# Patient Record
Sex: Male | Born: 1970
Health system: Southern US, Community
[De-identification: ages and names within clinical notes are randomized; demographics above are authoritative.]

## PROBLEM LIST (undated history)

## (undated) DIAGNOSIS — E119 Type 2 diabetes mellitus without complications: Secondary | ICD-10-CM

## (undated) DIAGNOSIS — G629 Polyneuropathy, unspecified: Secondary | ICD-10-CM

## (undated) HISTORY — DX: Type 2 diabetes mellitus without complications: E11.9

---

## 1998-01-20 ENCOUNTER — Emergency Department (HOSPITAL_COMMUNITY): Admission: EM | Admit: 1998-01-20 | Discharge: 1998-01-20 | Payer: Self-pay | Admitting: Emergency Medicine

## 2004-02-09 ENCOUNTER — Ambulatory Visit (HOSPITAL_COMMUNITY): Admission: RE | Admit: 2004-02-09 | Discharge: 2004-02-09 | Payer: Self-pay | Admitting: *Deleted

## 2004-02-09 ENCOUNTER — Emergency Department (HOSPITAL_COMMUNITY): Admission: EM | Admit: 2004-02-09 | Discharge: 2004-02-09 | Payer: Self-pay | Admitting: *Deleted

## 2006-09-14 ENCOUNTER — Emergency Department (HOSPITAL_COMMUNITY): Admission: EM | Admit: 2006-09-14 | Discharge: 2006-09-14 | Payer: Self-pay | Admitting: Emergency Medicine

## 2007-10-06 ENCOUNTER — Emergency Department (HOSPITAL_COMMUNITY): Admission: EM | Admit: 2007-10-06 | Discharge: 2007-10-06 | Payer: Self-pay | Admitting: Emergency Medicine

## 2008-04-06 ENCOUNTER — Ambulatory Visit: Payer: Self-pay | Admitting: Internal Medicine

## 2008-04-06 DIAGNOSIS — E669 Obesity, unspecified: Secondary | ICD-10-CM | POA: Insufficient documentation

## 2008-04-06 DIAGNOSIS — J309 Allergic rhinitis, unspecified: Secondary | ICD-10-CM | POA: Insufficient documentation

## 2008-04-06 DIAGNOSIS — R030 Elevated blood-pressure reading, without diagnosis of hypertension: Secondary | ICD-10-CM

## 2008-04-10 ENCOUNTER — Telehealth: Payer: Self-pay | Admitting: Internal Medicine

## 2008-04-11 ENCOUNTER — Encounter: Admission: RE | Admit: 2008-04-11 | Discharge: 2008-04-11 | Payer: Self-pay | Admitting: Internal Medicine

## 2008-09-25 ENCOUNTER — Emergency Department (HOSPITAL_COMMUNITY): Admission: EM | Admit: 2008-09-25 | Discharge: 2008-09-25 | Payer: Self-pay | Admitting: Emergency Medicine

## 2008-11-13 ENCOUNTER — Ambulatory Visit: Payer: Self-pay | Admitting: Internal Medicine

## 2008-11-13 DIAGNOSIS — R7309 Other abnormal glucose: Secondary | ICD-10-CM

## 2008-11-13 LAB — CONVERTED CEMR LAB
BUN: 9 mg/dL (ref 6–23)
CO2: 30 meq/L (ref 19–32)
Calcium: 9.3 mg/dL (ref 8.4–10.5)
Chloride: 103 meq/L (ref 96–112)
Creatinine, Ser: 1.1 mg/dL (ref 0.4–1.5)
GFR calc non Af Amer: 97.18 mL/min (ref >60)
Glucose, Bld: 125 mg/dL — ABNORMAL HIGH (ref 70–99)
Hgb A1c MFr Bld: 6.1 % (ref 4.6–6.5)
Potassium: 4.1 meq/L (ref 3.5–5.1)
Sodium: 140 meq/L (ref 135–145)

## 2008-11-28 ENCOUNTER — Emergency Department (HOSPITAL_COMMUNITY): Admission: EM | Admit: 2008-11-28 | Discharge: 2008-11-28 | Payer: Self-pay | Admitting: Emergency Medicine

## 2009-02-28 ENCOUNTER — Encounter: Admission: RE | Admit: 2009-02-28 | Discharge: 2009-02-28 | Payer: Self-pay | Admitting: *Deleted

## 2009-07-22 ENCOUNTER — Emergency Department (HOSPITAL_COMMUNITY): Admission: EM | Admit: 2009-07-22 | Discharge: 2009-07-22 | Payer: Self-pay | Admitting: Emergency Medicine

## 2009-08-08 ENCOUNTER — Observation Stay (HOSPITAL_COMMUNITY): Admission: EM | Admit: 2009-08-08 | Discharge: 2009-08-09 | Payer: Self-pay | Admitting: Emergency Medicine

## 2010-05-26 IMAGING — CR DG NECK SOFT TISSUE
2 series · 2 of 2 positions shown · non-contrast
Comparison: Cervical series 02/28/2009.

CLINICAL DATA: 39-year-old male with recent sinus infection.
Facial swelling.  Fever.  Possible allergic reaction.

NECK SOFT TISSUES - 1+ VIEW

[w soft tissue neck *]
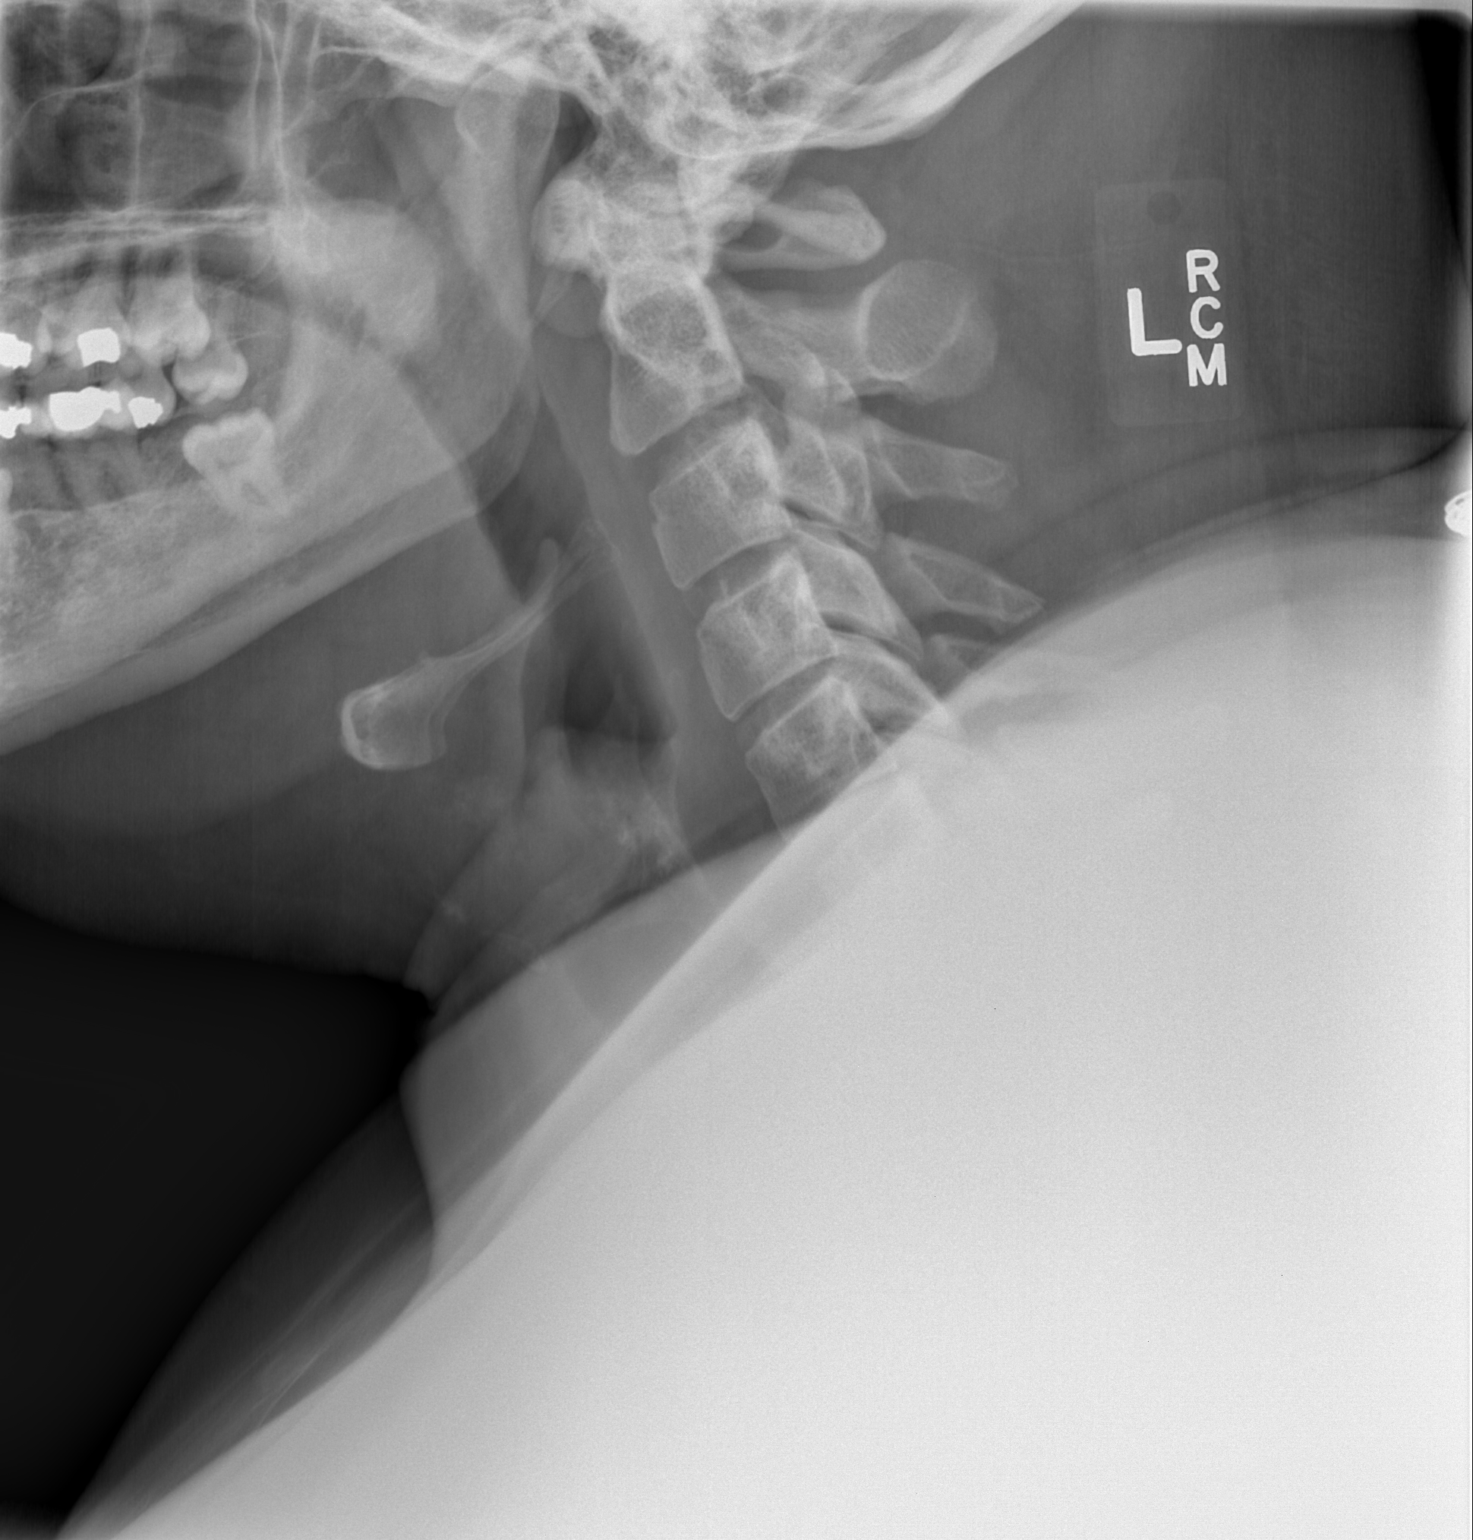

[w soft tissue neck ap]
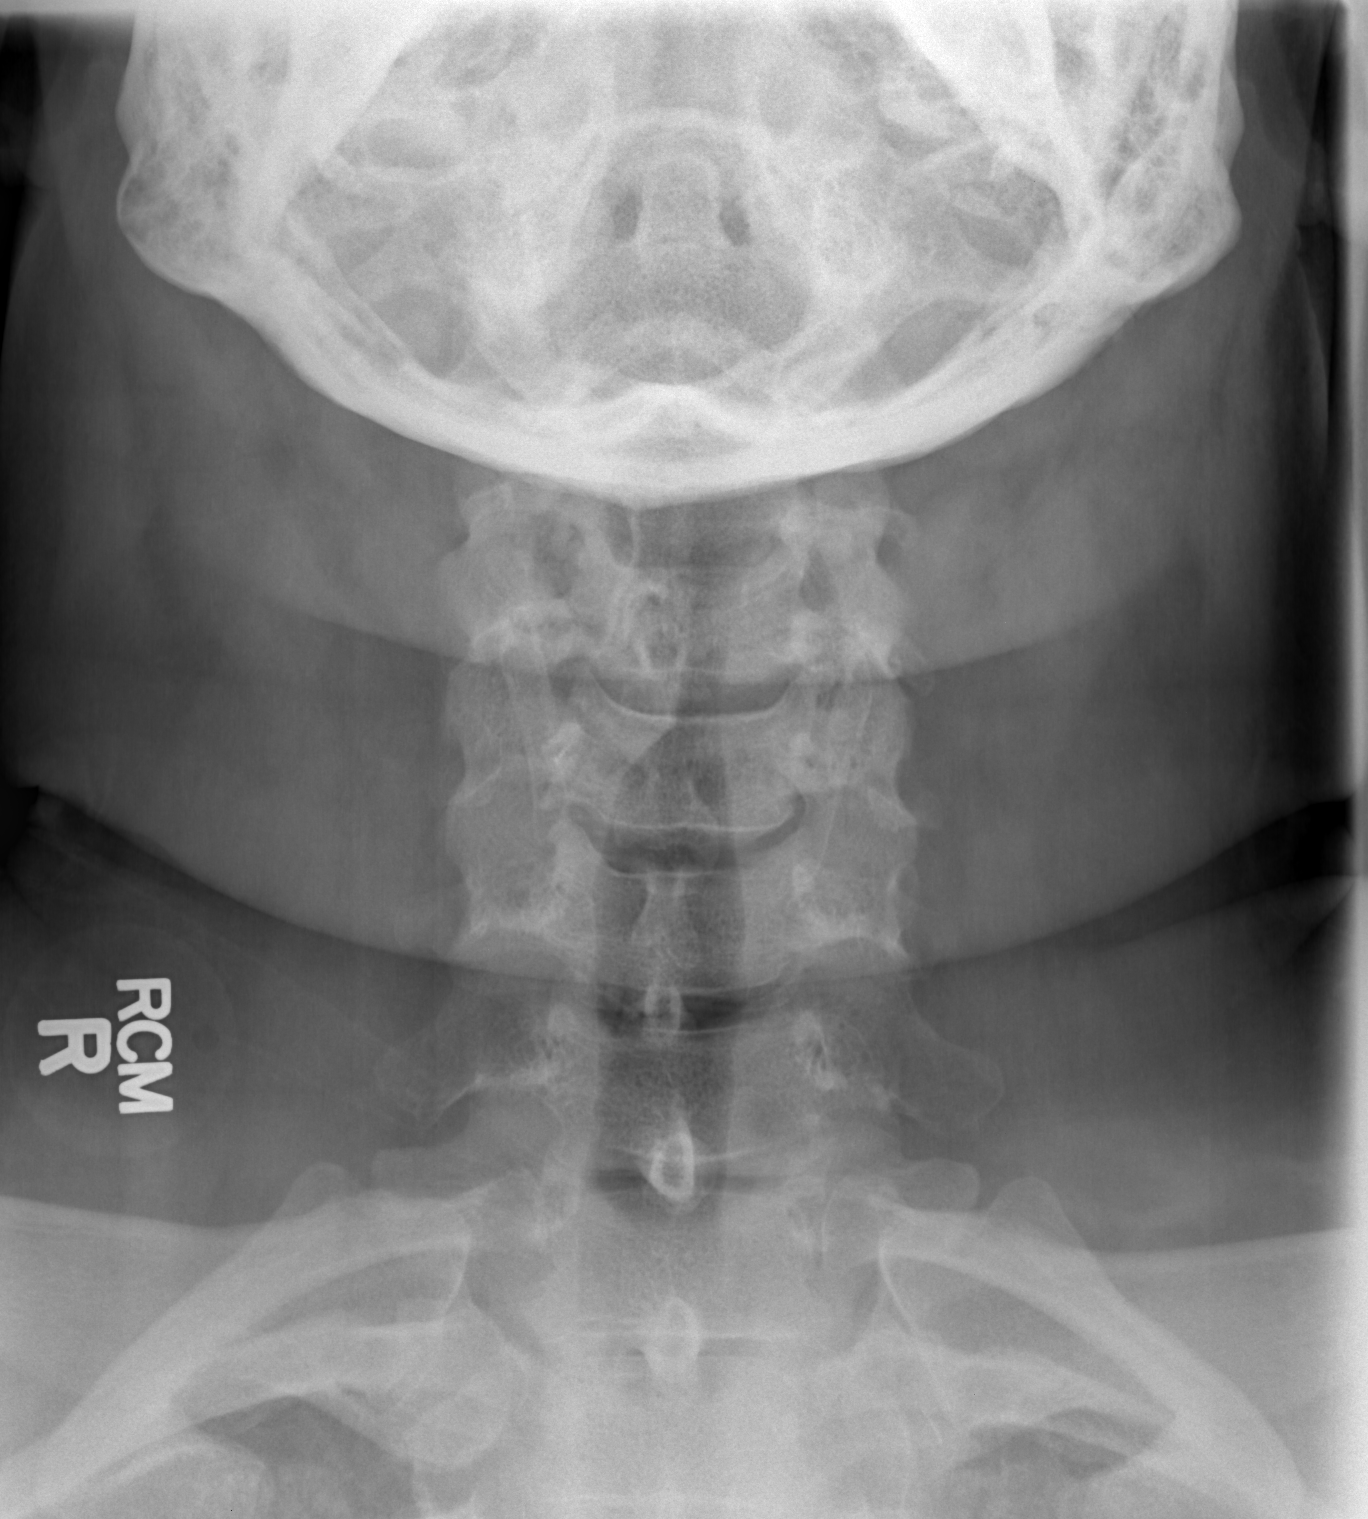

[2 of 2 positions shown; findings below may reference images not displayed]

FINDINGS: Stable prevertebral soft tissue contours.  Large body
habitus.  Pharyngeal soft tissue contours including epiglottis are
within normal limits. Visualized tracheal air column is within
normal limits.
IMPRESSION: Pharyngeal soft tissue contours appear stable compared to 2939.

## 2010-06-02 LAB — CONVERTED CEMR LAB
ALT: 26 units/L (ref 0–53)
AST: 24 units/L (ref 0–37)
Albumin: 3.6 g/dL (ref 3.5–5.2)
Alkaline Phosphatase: 73 units/L (ref 39–117)
BUN: 9 mg/dL (ref 6–23)
Bacteria, UA: NEGATIVE
Basophils Absolute: 0 10*3/uL (ref 0.0–0.1)
Basophils Relative: 0.5 % (ref 0.0–3.0)
Bilirubin Urine: NEGATIVE
Bilirubin, Direct: 0.1 mg/dL (ref 0.0–0.3)
CO2: 29 meq/L (ref 19–32)
Calcium: 8.8 mg/dL (ref 8.4–10.5)
Chloride: 108 meq/L (ref 96–112)
Creatinine, Ser: 1.1 mg/dL (ref 0.4–1.5)
Crystals: NEGATIVE
Eosinophils Absolute: 0.2 10*3/uL (ref 0.0–0.7)
Eosinophils Relative: 2.7 % (ref 0.0–5.0)
GFR calc Af Amer: 98 mL/min
GFR calc non Af Amer: 81 mL/min
Glucose, Bld: 109 mg/dL — ABNORMAL HIGH (ref 70–99)
HCT: 39.3 % (ref 39.0–52.0)
Hemoglobin, Urine: NEGATIVE
Hemoglobin: 14.2 g/dL (ref 13.0–17.0)
Ketones, ur: NEGATIVE mg/dL
Leukocytes, UA: NEGATIVE
Lymphocytes Relative: 31.7 % (ref 12.0–46.0)
MCHC: 36 g/dL (ref 30.0–36.0)
MCV: 91.6 fL (ref 78.0–100.0)
Monocytes Absolute: 0.4 10*3/uL (ref 0.1–1.0)
Monocytes Relative: 5.6 % (ref 3.0–12.0)
Neutro Abs: 4.7 10*3/uL (ref 1.4–7.7)
Neutrophils Relative %: 59.5 % (ref 43.0–77.0)
Nitrite: NEGATIVE
Platelets: 266 10*3/uL (ref 150–400)
Potassium: 3.9 meq/L (ref 3.5–5.1)
RBC / HPF: NONE SEEN
RBC: 4.29 M/uL (ref 4.22–5.81)
RDW: 12.9 % (ref 11.5–14.6)
Sodium: 142 meq/L (ref 135–145)
Specific Gravity, Urine: 1.015 (ref 1.000–1.03)
TSH: 1.95 microintl units/mL (ref 0.35–5.50)
Total Bilirubin: 0.6 mg/dL (ref 0.3–1.2)
Total Protein, Urine: NEGATIVE mg/dL
Total Protein: 6.9 g/dL (ref 6.0–8.3)
Urine Glucose: NEGATIVE mg/dL
Urobilinogen, UA: 0.2 (ref 0.0–1.0)
WBC: 7.7 10*3/uL (ref 4.5–10.5)
pH: 7.5 (ref 5.0–8.0)

## 2010-06-06 NOTE — Letter (Signed)
Summary: Results Follow-up Letter  [facility]  [address] [place], [place] [address]   Phone: [phone]  Fax: [phone]    04/06/2008  [address] [place], [place]  [address]  Dear Guy Gonzalez,   The following are the results of your recent test(s):  Test       Result     Complete blood count   Normal Blood sugar       Slightly elevated Liver/kidney     Normal Urine         Normal Thyroid       Normal  _________________________________________________________  Please call for an appointment as directed _________________________________________________________ _________________________________________________________ _________________________________________________________  Sincerely,  Sanda Linger MD [facility]

## 2010-07-24 LAB — URINALYSIS, ROUTINE W REFLEX MICROSCOPIC
Bilirubin Urine: NEGATIVE
Glucose, UA: NEGATIVE mg/dL
Hgb urine dipstick: NEGATIVE
Ketones, ur: NEGATIVE mg/dL
Nitrite: NEGATIVE
Protein, ur: NEGATIVE mg/dL
Specific Gravity, Urine: 1.014 (ref 1.005–1.030)
Urobilinogen, UA: 0.2 mg/dL (ref 0.0–1.0)
pH: 5.5 (ref 5.0–8.0)

## 2010-07-24 LAB — BASIC METABOLIC PANEL
BUN: 10 mg/dL (ref 6–23)
CO2: 28 mEq/L (ref 19–32)
Calcium: 8.8 mg/dL (ref 8.4–10.5)
Chloride: 103 mEq/L (ref 96–112)
Creatinine, Ser: 1.16 mg/dL (ref 0.4–1.5)
GFR calc Af Amer: >60 mL/min (ref >60)
GFR calc non Af Amer: >60 mL/min (ref >60)
Glucose, Bld: 147 mg/dL — ABNORMAL HIGH (ref 70–99)
Potassium: 4.1 mEq/L (ref 3.5–5.1)
Sodium: 136 mEq/L (ref 135–145)

## 2010-07-24 LAB — CBC
HCT: 42.9 % (ref 39.0–52.0)
Hemoglobin: 14.3 g/dL (ref 13.0–17.0)
MCHC: 33.3 g/dL (ref 30.0–36.0)
MCV: 94.8 fL (ref 78.0–100.0)
Platelets: 270 10*3/uL (ref 150–400)
RBC: 4.53 MIL/uL (ref 4.22–5.81)
RDW: 13.7 % (ref 11.5–15.5)
WBC: 10.3 10*3/uL (ref 4.0–10.5)

## 2010-07-24 LAB — DIFFERENTIAL
Basophils Absolute: 0 10*3/uL (ref 0.0–0.1)
Basophils Relative: 0 % (ref 0–1)
Eosinophils Absolute: 0.1 10*3/uL (ref 0.0–0.7)
Eosinophils Relative: 1 % (ref 0–5)
Lymphocytes Relative: 20 % (ref 12–46)
Lymphs Abs: 2 10*3/uL (ref 0.7–4.0)
Monocytes Absolute: 0.2 10*3/uL (ref 0.1–1.0)
Monocytes Relative: 2 % — ABNORMAL LOW (ref 3–12)
Neutro Abs: 7.9 10*3/uL — ABNORMAL HIGH (ref 1.7–7.7)
Neutrophils Relative %: 77 % (ref 43–77)

## 2010-08-11 LAB — BASIC METABOLIC PANEL
BUN: 9 mg/dL (ref 6–23)
CO2: 24 mEq/L (ref 19–32)
Calcium: 9 mg/dL (ref 8.4–10.5)
Chloride: 106 mEq/L (ref 96–112)
Creatinine, Ser: 0.93 mg/dL (ref 0.4–1.5)
GFR calc Af Amer: >60 mL/min (ref >60)
GFR calc non Af Amer: >60 mL/min (ref >60)
Glucose, Bld: 128 mg/dL — ABNORMAL HIGH (ref 70–99)
Potassium: 3.9 mEq/L (ref 3.5–5.1)
Sodium: 138 mEq/L (ref 135–145)

## 2010-08-11 LAB — DIFFERENTIAL
Basophils Absolute: 0 10*3/uL (ref 0.0–0.1)
Basophils Relative: 1 % (ref 0–1)
Eosinophils Absolute: 0.2 10*3/uL (ref 0.0–0.7)
Eosinophils Relative: 3 % (ref 0–5)
Lymphocytes Relative: 40 % (ref 12–46)
Lymphs Abs: 2.4 10*3/uL (ref 0.7–4.0)
Monocytes Absolute: 0.4 10*3/uL (ref 0.1–1.0)
Monocytes Relative: 7 % (ref 3–12)
Neutro Abs: 3 10*3/uL (ref 1.7–7.7)
Neutrophils Relative %: 50 % (ref 43–77)

## 2010-08-11 LAB — CBC
HCT: 44.3 % (ref 39.0–52.0)
Hemoglobin: 14.4 g/dL (ref 13.0–17.0)
MCHC: 32.6 g/dL (ref 30.0–36.0)
MCV: 93.7 fL (ref 78.0–100.0)
Platelets: 287 10*3/uL (ref 150–400)
RBC: 4.73 MIL/uL (ref 4.22–5.81)
RDW: 13.8 % (ref 11.5–15.5)
WBC: 6 10*3/uL (ref 4.0–10.5)

## 2010-08-11 LAB — APTT: aPTT: 28 seconds (ref 24–37)

## 2010-08-11 LAB — PROTIME-INR
INR: 1 (ref 0.00–1.49)
Prothrombin Time: 13.3 seconds (ref 11.6–15.2)

## 2010-11-13 ENCOUNTER — Observation Stay (HOSPITAL_COMMUNITY)
Admission: EM | Admit: 2010-11-13 | Discharge: 2010-11-13 | Disposition: A | Discharge status: Home / Self Care | Payer: BC Managed Care – PPO | Attending: Emergency Medicine | Admitting: Emergency Medicine

## 2010-11-13 ENCOUNTER — Emergency Department (HOSPITAL_COMMUNITY): Payer: BC Managed Care – PPO

## 2010-11-13 DIAGNOSIS — E669 Obesity, unspecified: Secondary | ICD-10-CM | POA: Insufficient documentation

## 2010-11-13 DIAGNOSIS — R079 Chest pain, unspecified: Principal | ICD-10-CM | POA: Insufficient documentation

## 2010-11-13 LAB — COMPREHENSIVE METABOLIC PANEL
ALT: 26 U/L (ref 0–53)
AST: 23 U/L (ref 0–37)
Albumin: 3.8 g/dL (ref 3.5–5.2)
Alkaline Phosphatase: 68 U/L (ref 39–117)
BUN: 7 mg/dL (ref 6–23)
CO2: 26 mEq/L (ref 19–32)
Calcium: 9 mg/dL (ref 8.4–10.5)
Chloride: 102 mEq/L (ref 96–112)
Creatinine, Ser: 0.9 mg/dL (ref 0.50–1.35)
GFR calc Af Amer: >60 mL/min (ref >60)
GFR calc non Af Amer: >60 mL/min (ref >60)
Glucose, Bld: 133 mg/dL — ABNORMAL HIGH (ref 70–99)
Potassium: 3.9 mEq/L (ref 3.5–5.1)
Sodium: 138 mEq/L (ref 135–145)
Total Bilirubin: 0.3 mg/dL (ref 0.3–1.2)
Total Protein: 7 g/dL (ref 6.0–8.3)

## 2010-11-13 LAB — CK TOTAL AND CKMB (NOT AT ARMC)
CK, MB: 6 ng/mL — ABNORMAL HIGH (ref 0.3–4.0)
CK, MB: 5.8 ng/mL — ABNORMAL HIGH (ref 0.3–4.0)
Relative Index: 1.1 (ref 0.0–2.5)
Total CK: 540 U/L — ABNORMAL HIGH (ref 7–232)
Total CK: 473 U/L — ABNORMAL HIGH (ref 7–232)
Total CK: 444 U/L — ABNORMAL HIGH (ref 7–232)

## 2010-11-13 LAB — CBC
HCT: 42.4 % (ref 39.0–52.0)
Hemoglobin: 14.4 g/dL (ref 13.0–17.0)
MCH: 30.8 pg (ref 26.0–34.0)
MCHC: 34 g/dL (ref 30.0–36.0)
MCV: 90.8 fL (ref 78.0–100.0)
Platelets: 253 10*3/uL (ref 150–400)
RBC: 4.67 MIL/uL (ref 4.22–5.81)
RDW: 13.3 % (ref 11.5–15.5)
WBC: 6.3 10*3/uL (ref 4.0–10.5)

## 2010-11-13 LAB — D-DIMER, QUANTITATIVE: D-Dimer, Quant: <0.22 ug/mL-FEU (ref 0.00–0.48)

## 2010-11-13 LAB — TROPONIN I
Troponin I: <0.3 ng/mL (ref <0.30)
Troponin I: <0.3 ng/mL (ref <0.30)
Troponin I: <0.3 ng/mL (ref <0.30)

## 2010-11-13 MED ORDER — IOHEXOL 350 MG/ML SOLN
100.0000 mL | Freq: Once | INTRAVENOUS | Status: AC | PRN
  Administered 2010-11-13: 100 mL via INTRAVENOUS
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
## 2010-12-19 ENCOUNTER — Ambulatory Visit (HOSPITAL_COMMUNITY): Payer: BC Managed Care – PPO

## 2010-12-30 ENCOUNTER — Ambulatory Visit (HOSPITAL_COMMUNITY): Payer: BC Managed Care – PPO

## 2011-01-30 LAB — CBC
HCT: 43
Hemoglobin: 14.8
MCV: 92
RDW: 14.1

## 2011-01-30 LAB — COMPREHENSIVE METABOLIC PANEL
Alkaline Phosphatase: 75
BUN: 4 — ABNORMAL LOW
Chloride: 104
Creatinine, Ser: 1.02
Glucose, Bld: 118 — ABNORMAL HIGH
Potassium: 4.7
Total Bilirubin: 0.7
Total Protein: 6.8

## 2011-01-30 LAB — DIFFERENTIAL
Basophils Absolute: 0
Basophils Relative: 1
Lymphocytes Relative: 39
Neutro Abs: 2.9
Neutrophils Relative %: 50

## 2011-01-30 LAB — LIPASE, BLOOD: Lipase: 14

## 2013-06-10 ENCOUNTER — Other Ambulatory Visit: Payer: Self-pay | Admitting: Family Medicine

## 2013-06-10 DIAGNOSIS — R109 Unspecified abdominal pain: Secondary | ICD-10-CM

## 2013-06-20 ENCOUNTER — Other Ambulatory Visit: Payer: BC Managed Care – PPO

## 2013-06-24 ENCOUNTER — Other Ambulatory Visit: Payer: Self-pay | Admitting: Family Medicine

## 2013-06-24 ENCOUNTER — Ambulatory Visit
Admission: RE | Admit: 2013-06-24 | Discharge: 2013-06-24 | Disposition: A | Discharge status: Home / Self Care | Payer: BC Managed Care – PPO | Source: Ambulatory Visit | Attending: Family Medicine | Admitting: Family Medicine

## 2013-06-24 DIAGNOSIS — R109 Unspecified abdominal pain: Secondary | ICD-10-CM

## 2015-05-12 ENCOUNTER — Emergency Department (HOSPITAL_COMMUNITY): Payer: Self-pay

## 2015-05-12 ENCOUNTER — Encounter (HOSPITAL_COMMUNITY): Payer: Self-pay

## 2015-05-12 ENCOUNTER — Emergency Department (HOSPITAL_COMMUNITY)
Admission: EM | Admit: 2015-05-12 | Discharge: 2015-05-12 | Disposition: A | Discharge status: Home / Self Care | Payer: Self-pay | Attending: Emergency Medicine | Admitting: Emergency Medicine

## 2015-05-12 DIAGNOSIS — Y92410 Unspecified street and highway as the place of occurrence of the external cause: Secondary | ICD-10-CM | POA: Insufficient documentation

## 2015-05-12 DIAGNOSIS — Y9389 Activity, other specified: Secondary | ICD-10-CM | POA: Insufficient documentation

## 2015-05-12 DIAGNOSIS — Z87891 Personal history of nicotine dependence: Secondary | ICD-10-CM | POA: Insufficient documentation

## 2015-05-12 DIAGNOSIS — T07XXXA Unspecified multiple injuries, initial encounter: Secondary | ICD-10-CM

## 2015-05-12 DIAGNOSIS — S4991XA Unspecified injury of right shoulder and upper arm, initial encounter: Secondary | ICD-10-CM | POA: Insufficient documentation

## 2015-05-12 DIAGNOSIS — S0990XA Unspecified injury of head, initial encounter: Secondary | ICD-10-CM | POA: Insufficient documentation

## 2015-05-12 DIAGNOSIS — T148 Other injury of unspecified body region: Secondary | ICD-10-CM | POA: Insufficient documentation

## 2015-05-12 DIAGNOSIS — Z880 Allergy status to penicillin: Secondary | ICD-10-CM | POA: Insufficient documentation

## 2015-05-12 DIAGNOSIS — S3992XA Unspecified injury of lower back, initial encounter: Secondary | ICD-10-CM | POA: Insufficient documentation

## 2015-05-12 DIAGNOSIS — Y998 Other external cause status: Secondary | ICD-10-CM | POA: Insufficient documentation

## 2015-05-12 DIAGNOSIS — S29001A Unspecified injury of muscle and tendon of front wall of thorax, initial encounter: Secondary | ICD-10-CM | POA: Insufficient documentation

## 2015-05-12 MED ORDER — HYDROCODONE-ACETAMINOPHEN 5-325 MG PO TABS: 2.0000 | ORAL_TABLET | ORAL | Status: DC | PRN

## 2015-05-12 MED ORDER — CYCLOBENZAPRINE HCL 10 MG PO TABS: 10.0000 mg | ORAL_TABLET | Freq: Three times a day (TID) | ORAL | Status: DC | PRN

## 2015-05-12 NOTE — ED Notes (Signed)
Pt in xray
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
## 2015-05-12 NOTE — Discharge Instructions (Signed)
Contusion °A contusion is a deep bruise. Contusions are the result of a blunt injury to tissues and muscle fibers under the skin. The injury causes bleeding under the skin. The skin overlying the contusion may turn blue, purple, or yellow. Minor injuries will give you a painless contusion, but more severe contusions may stay painful and swollen for a few weeks.  °CAUSES  °This condition is usually caused by a blow, trauma, or direct force to an area of the body. °SYMPTOMS  °Symptoms of this condition include: °· Swelling of the injured area. °· Pain and tenderness in the injured area. °· Discoloration. The area may have redness and then turn blue, purple, or yellow. °DIAGNOSIS  °This condition is diagnosed based on a physical exam and medical history. An X-ray, CT scan, or MRI may be needed to determine if there are any associated injuries, such as broken bones (fractures). °TREATMENT  °Specific treatment for this condition depends on what area of the body was injured. In general, the best treatment for a contusion is resting, icing, applying pressure to (compression), and elevating the injured area. This is often called the RICE strategy. Over-the-counter anti-inflammatory medicines may also be recommended for pain control.  °HOME CARE INSTRUCTIONS  °· Rest the injured area. °· If directed, apply ice to the injured area: °· Put ice in a plastic bag. °· Place a towel between your skin and the bag. °· Leave the ice on for 20 minutes, 2-3 times per day. °· If directed, apply light compression to the injured area using an elastic bandage. Make sure the bandage is not wrapped too tightly. Remove and reapply the bandage as directed by your health care provider. °· If possible, raise (elevate) the injured area above the level of your heart while you are sitting or lying down. °· Take over-the-counter and prescription medicines only as told by your health care provider. °SEEK MEDICAL CARE IF: °· Your symptoms do not  improve after several days of treatment. °· Your symptoms get worse. °· You have difficulty moving the injured area. °SEEK IMMEDIATE MEDICAL CARE IF:  °· You have severe pain. °· You have numbness in a hand or foot. °· Your hand or foot turns pale or cold. °  °This information is not intended to replace advice given to you by your health care provider. Make sure you discuss any questions you have with your health care provider. °  °Document Released: 01/29/2005 Document Revised: 01/10/2015 Document Reviewed: 09/06/2014 °Elsevier Interactive Patient Education ©2016 [employer] ° °Motor Vehicle Collision °It is common to have multiple bruises and sore muscles after a motor vehicle collision (MVC). These tend to feel worse for the first 24 hours. You may have the most stiffness and soreness over the first several hours. You may also feel worse when you wake up the first morning after your collision. After this point, you will usually begin to improve with each day. The speed of improvement often depends on the severity of the collision, the number of injuries, and the location and nature of these injuries. °HOME CARE INSTRUCTIONS °· Put ice on the injured area. °¨ Put ice in a plastic bag. °¨ Place a towel between your skin and the bag. °¨ Leave the ice on for 15-20 minutes, 3-4 times a day, or as directed by your health care provider. °· Drink enough fluids to keep your urine clear or pale yellow. Do not drink alcohol. °· Take a warm shower or bath once or twice a   day. This will increase blood flow to sore muscles. °· You may return to activities as directed by your caregiver. Be careful when lifting, as this may aggravate neck or back pain. °· Only take over-the-counter or prescription medicines for pain, discomfort, or fever as directed by your caregiver. Do not use aspirin. This may increase bruising and bleeding. °SEEK IMMEDIATE MEDICAL CARE IF: °· You have numbness, tingling, or weakness in the arms or  legs. °· You develop severe headaches not relieved with medicine. °· You have severe neck pain, especially tenderness in the middle of the back of your neck. °· You have changes in bowel or bladder control. °· There is increasing pain in any area of the body. °· You have shortness of breath, light-headedness, dizziness, or fainting. °· You have chest pain. °· You feel sick to your stomach (nauseous), throw up (vomit), or sweat. °· You have increasing abdominal discomfort. °· There is blood in your urine, stool, or vomit. °· You have pain in your shoulder (shoulder strap areas). °· You feel your symptoms are getting worse. °MAKE SURE YOU: °· Understand these instructions. °· Will watch your condition. °· Will get help right away if you are not doing well or get worse. °  °This information is not intended to replace advice given to you by your health care provider. Make sure you discuss any questions you have with your health care provider. °  °Document Released: 04/21/2005 Document Revised: 05/12/2014 Document Reviewed: 09/18/2010 °Elsevier Interactive Patient Education ©2016 [employer] ° °
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
## 2015-05-12 NOTE — ED Notes (Signed)
MD at bedside. 

## 2015-12-13 ENCOUNTER — Encounter: Payer: PRIVATE HEALTH INSURANCE | Attending: Family

## 2015-12-13 DIAGNOSIS — Z029 Encounter for administrative examinations, unspecified: Secondary | ICD-10-CM | POA: Insufficient documentation

## 2015-12-13 DIAGNOSIS — E119 Type 2 diabetes mellitus without complications: Secondary | ICD-10-CM

## 2015-12-13 NOTE — Progress Notes (Signed)

## 2015-12-20 DIAGNOSIS — Z029 Encounter for administrative examinations, unspecified: Secondary | ICD-10-CM | POA: Diagnosis not present

## 2015-12-20 DIAGNOSIS — E119 Type 2 diabetes mellitus without complications: Secondary | ICD-10-CM

## 2015-12-20 NOTE — Progress Notes (Signed)

## 2015-12-27 ENCOUNTER — Encounter: Payer: PRIVATE HEALTH INSURANCE | Admitting: Skilled Nursing Facility1

## 2015-12-27 DIAGNOSIS — E119 Type 2 diabetes mellitus without complications: Secondary | ICD-10-CM

## 2015-12-27 DIAGNOSIS — Z029 Encounter for administrative examinations, unspecified: Secondary | ICD-10-CM | POA: Diagnosis not present

## 2015-12-28 ENCOUNTER — Encounter: Payer: Self-pay | Admitting: Skilled Nursing Facility1

## 2015-12-28 NOTE — Progress Notes (Signed)
Patient was seen on 12/28/2015 for the third of a series of three diabetes self-management courses at the [facility]. The following learning objectives were met by the patient during this class:  . State the amount of activity recommended for healthy living . Describe activities suitable for individual needs . Identify ways to regularly incorporate activity into daily life . Identify barriers to activity and ways to over come these barriers  Identify diabetes medications being personally used and their primary action for lowering glucose and possible side effects . Describe role of stress on blood glucose and develop strategies to address psychosocial issues . Identify diabetes complications and ways to prevent them  Explain how to manage diabetes during illness . Evaluate success in meeting personal goal . Establish 2-3 goals that they will plan to diligently work on until they return for the  8-monthfollow-up visit  Goals:   I will count my carb choices at most meals and snacks  I will be active 60 minutes or more 5 times a week  I will take my diabetes medications as scheduled  I will eat less unhealthy fats by eating less fried foods  I will test my glucose at least 2 times a day, 7 days a week  I will look at patterns in my record book at least 8 days a month  To help manage stress I will  Work out at least 5 times a week  Your patient has identified these potential barriers to change:  Finances Stress  Your patient has identified their diabetes self-care support plan as  [facility]  Attend Monthly Diabetes Support Group as needed or make a future follow up appointment

## 2016-07-02 ENCOUNTER — Emergency Department (HOSPITAL_COMMUNITY): Payer: PRIVATE HEALTH INSURANCE

## 2016-07-02 ENCOUNTER — Encounter (HOSPITAL_COMMUNITY): Payer: Self-pay | Admitting: Emergency Medicine

## 2016-07-02 ENCOUNTER — Emergency Department (HOSPITAL_COMMUNITY)
Admission: EM | Admit: 2016-07-02 | Discharge: 2016-07-02 | Disposition: A | Discharge status: Home / Self Care | Payer: PRIVATE HEALTH INSURANCE | Attending: Emergency Medicine | Admitting: Emergency Medicine

## 2016-07-02 DIAGNOSIS — J309 Allergic rhinitis, unspecified: Secondary | ICD-10-CM | POA: Insufficient documentation

## 2016-07-02 DIAGNOSIS — Z87891 Personal history of nicotine dependence: Secondary | ICD-10-CM | POA: Insufficient documentation

## 2016-07-02 DIAGNOSIS — J069 Acute upper respiratory infection, unspecified: Secondary | ICD-10-CM | POA: Insufficient documentation

## 2016-07-02 DIAGNOSIS — E119 Type 2 diabetes mellitus without complications: Secondary | ICD-10-CM | POA: Insufficient documentation

## 2016-07-02 LAB — CBG MONITORING, ED: Glucose-Capillary: 115 mg/dL — ABNORMAL HIGH (ref 65–99)

## 2016-07-02 MED ORDER — CETIRIZINE HCL 10 MG PO TABS: 10.0000 mg | ORAL_TABLET | Freq: Every day | ORAL | 1 refills | Status: DC

## 2016-07-02 MED ORDER — FLUTICASONE PROPIONATE 50 MCG/ACT NA SUSP: 2.0000 | Freq: Every day | NASAL | 1 refills | Status: DC

## 2016-07-02 NOTE — ED Provider Notes (Signed)
[facility]-EMERGENCY DEPT Provider Note   CSN: [id number] Arrival date & time: 07/02/16  0606     History   Chief Complaint Chief Complaint  Patient presents with  . Nasal Congestion    HPI Guy Gonzalez is a 46 y.o. male.  Guy Gonzalez is a 46 y.o. Male who presents to the ED complaining of nasal congestion, post nasal drip, runny nose and hoarse voice for about a week.  He reports using sudafed with little relief. He reports feeling like his chest is congested. He reports minimal cough. No wheezing or shortness of breath. He denies fevers, sore throat, trouble swallowing, chest pain, wheezing, shortness of breath, abdominal pain, nausea, vomiting, hemoptysis, leg swelling, or rashes.    The history is provided by the patient. No language interpreter was used.    Past Medical History:  Diagnosis Date  . Diabetes mellitus without complication [facility]     Patient Active Problem List   Diagnosis Date Noted  . HYPERGLYCEMIA 11/13/2008  . OBESITY 04/06/2008  . Allergic rhinitis, cause unspecified 04/06/2008  . ELEVATED BLOOD PRESSURE WITHOUT DIAGNOSIS OF HYPERTENSION 04/06/2008    History reviewed. No pertinent surgical history.     Home Medications    Prior to Admission medications   Medication Sig Start Date End Date Taking? Authorizing Provider  cetirizine (ZYRTEC ALLERGY) 10 MG tablet Take 1 tablet (10 mg total) by mouth daily. 07/02/16   Everlene Farrier, PA-C  cyclobenzaprine (FLEXERIL) 10 MG tablet Take 1 tablet (10 mg total) by mouth 3 (three) times daily as needed for muscle spasms. 05/12/15   Gilda Crease, MD  fluticasone (FLONASE) 50 MCG/ACT nasal spray Place 2 sprays into both nostrils daily. 07/02/16   Everlene Farrier, PA-C  HYDROcodone-acetaminophen (NORCO/VICODIN) 5-325 MG tablet Take 2 tablets by mouth every 4 (four) hours as needed for moderate pain. 05/12/15   Gilda Crease, MD    Family History History reviewed. No pertinent family  history.  Social History Social History  Substance Use Topics  . Smoking status: Former Smoker    Types: Cigarettes    Quit date: 05/11/2000  . Smokeless tobacco: Never Used  . Alcohol use Yes     Comment: socially     Allergies   Penicillins   Review of Systems Review of Systems  Constitutional: Negative for chills and fever.  HENT: Positive for congestion, postnasal drip, rhinorrhea and sneezing. Negative for sore throat and trouble swallowing.   Eyes: Negative for visual disturbance.  Respiratory: Positive for cough. Negative for shortness of breath and wheezing.   Cardiovascular: Negative for chest pain and palpitations.  Gastrointestinal: Negative for abdominal pain, diarrhea, nausea and vomiting.  Musculoskeletal: Negative for back pain and neck pain.  Skin: Negative for rash.  Neurological: Negative for headaches.     Physical Exam Updated Vital Signs BP 136/96 (BP Location: Right Arm)   Pulse 73   Temp 98.2 F (36.8 C) (Oral)   Resp 19   Ht 6\' 4"  (1.93 m)   Wt (!) 139.7 kg   SpO2 97%   BMI 37.49 kg/m   Physical Exam  Constitutional: He appears well-developed and well-nourished. No distress.  Nontoxic appearing.  HENT:  Head: Normocephalic and atraumatic.  Right Ear: External ear normal.  Left Ear: External ear normal.  Mouth/Throat: No oropharyngeal exudate.  Mild clear middle ear effusion noted bilaterally. No TM erythema or loss of landmarks. Boggy nasal turbinates and rhinorrhea present. Throat is clear. Evidence of postnasal drip. No tonsillar  hypertrophy or exudates. Uvula is midline without edema. No PTA.  No trismus. No drooling. Speech is clear and coherent.  Eyes: Conjunctivae are normal. Pupils are equal, round, and reactive to light. Right eye exhibits no discharge. Left eye exhibits no discharge.  Neck: Normal range of motion. Neck supple. No JVD present.  Cardiovascular: Normal rate, regular rhythm, normal heart sounds and intact distal  pulses.   Pulmonary/Chest: Effort normal and breath sounds normal. No stridor. No respiratory distress. He has no wheezes. He has no rales.  Lungs clear to auscultation bilaterally.  Abdominal: Soft. There is no tenderness.  Lymphadenopathy:    He has no cervical adenopathy.  Neurological: He is alert. Coordination normal.  Skin: Skin is warm and dry. Capillary refill takes less than 2 seconds. No rash noted. He is not diaphoretic. No erythema. No pallor.  Psychiatric: He has a normal mood and affect. His behavior is normal.  Nursing note and vitals reviewed.    ED Treatments / Results  Labs (all labs ordered are listed, but only abnormal results are displayed) Labs Reviewed  CBG MONITORING, ED - Abnormal; Notable for the following:       Result Value   Glucose-Capillary 115 (*)    All other components within normal limits  CBG MONITORING, ED    EKG  EKG Interpretation None       Radiology Dg Chest 2 View  Result Date: 07/02/2016 CLINICAL DATA:  Subacute onset of chest and nasal congestion. Hoarseness. Initial encounter. EXAM: CHEST  2 VIEW COMPARISON:  Chest radiograph performed 05/12/2015 FINDINGS: The lungs are well-aerated and clear. There is no evidence of focal opacification, pleural effusion or pneumothorax. The heart is normal in size; the mediastinal contour is within normal limits. No acute osseous abnormalities are seen. IMPRESSION: No acute cardiopulmonary process seen. Electronically Signed   By: Roanna RaiderJeffery  Chang M.D.   On: 07/02/2016 06:54    Procedures Procedures (including critical care time)  Medications Ordered in ED Medications - No data to display   Initial Impression / Assessment and Plan / ED Course  I have reviewed the triage vital signs and the nursing notes.  Pertinent labs & imaging results that were available during my care of the patient were reviewed by me and considered in my medical decision making (see chart for details).    This is a 46  y.o. Male who presents to the ED complaining of nasal congestion, post nasal drip, runny nose and hoarse voice for about a week.  On exam the patient is afebrile nontoxic appearing. His lungs good auscultation bilaterally. His rhinorrhea present. Postnasal drip in his posterior oropharynx. Chest x-ray obtained by triage nurse is unremarkable. We'll start the patient on Flonase and Zyrtec. I advised of his hoarse voice persists despite this treatment for his postnasal drip he can follow-up with ENT doctor Teoh. I advised the patient to follow-up with their primary care provider this week. I advised the patient to return to the emergency department with new or worsening symptoms or new concerns. The patient verbalized understanding and agreement with plan.      Final Clinical Impressions(s) / ED Diagnoses   Final diagnoses:  Viral upper respiratory tract infection  Acute allergic rhinitis, unspecified seasonality, unspecified trigger    New Prescriptions New Prescriptions   CETIRIZINE (ZYRTEC ALLERGY) 10 MG TABLET    Take 1 tablet (10 mg total) by mouth daily.   FLUTICASONE (FLONASE) 50 MCG/ACT NASAL SPRAY    Place 2 sprays into  both nostrils daily.     Everlene Farrier, PA-C 07/02/16 1610    Tomasita Crumble, MD 07/02/16 0900

## 2016-07-02 NOTE — ED Triage Notes (Signed)
Pt presents to ED with c/o congestion and nasal drainage for couple of days. Pt denies chest pain; denies fever or chills.

## 2016-08-19 ENCOUNTER — Ambulatory Visit: Payer: Self-pay

## 2016-08-19 ENCOUNTER — Encounter: Payer: Self-pay | Admitting: Nurse Practitioner

## 2016-08-19 ENCOUNTER — Ambulatory Visit (INDEPENDENT_AMBULATORY_CARE_PROVIDER_SITE_OTHER): Payer: Self-pay | Admitting: Nurse Practitioner

## 2016-08-19 VITALS — BP 132/90 | HR 83 | Temp 99.5°F | Wt 326.6 lb

## 2016-08-19 DIAGNOSIS — I10 Essential (primary) hypertension: Secondary | ICD-10-CM

## 2016-08-19 DIAGNOSIS — E119 Type 2 diabetes mellitus without complications: Secondary | ICD-10-CM

## 2016-08-19 MED ORDER — METFORMIN HCL 500 MG PO TABS: 1000.0000 mg | ORAL_TABLET | Freq: Two times a day (BID) | ORAL | 2 refills | Status: DC

## 2016-08-19 MED FILL — metFORMIN HCL 500 MG TABS: 500 | 90 days supply | Qty: 180 | Fill #0

## 2016-08-19 NOTE — Patient Instructions (Addendum)
Type 2 Diabetes Mellitus, Diagnosis, Adult Type 2 diabetes (type 2 diabetes mellitus) is a long-term (chronic) disease. In type 2 diabetes, one or both of these problems may be present:  The pancreas does not make enough of a hormone called insulin.  Cells in the body do not respond properly to insulin that the body makes (insulin resistance).  Normally, insulin allows blood sugar (glucose) to enter cells in the body. The cells use glucose for energy. Insulin resistance or lack of insulin causes excess glucose to build up in the blood instead of going into cells. As a result, high blood glucose (hyperglycemia) develops. What increases the risk? The following factors may make you more likely to develop type 2 diabetes:  Having a family member with type 2 diabetes.  Being overweight or obese.  Having an inactive (sedentary) lifestyle.  Having been diagnosed with insulin resistance.  Having a history of prediabetes, gestational diabetes, or polycystic ovarian syndrome (PCOS).  Being of American-Indian, African-American, Hispanic/Latino, or Asian/Pacific Islander descent.  What are the signs or symptoms? In the early stage of this condition, you may not have symptoms. Symptoms develop slowly and may include:  Increased thirst (polydipsia).  Increased hunger(polyphagia).  Increased urination (polyuria).  Increased urination during the night (nocturia).  Unexplained weight loss.  Frequent infections that keep coming back (recurring).  Fatigue.  Weakness.  Vision changes, such as blurry vision.  Cuts or bruises that are slow to heal.  Tingling or numbness in the hands or feet.  Dark patches on the skin (acanthosis nigricans).  How is this diagnosed?  This condition is diagnosed based on your symptoms, your medical history, a physical exam, and your blood glucose level. Your blood glucose may be checked with one or more of the following blood tests:  A fasting blood  glucose (FBG) test. You will not be allowed to eat (you will fast) for at least 8 hours before a blood sample is taken.  A random blood glucose test. This checks blood glucose at any time of day regardless of when you ate.  An A1c (hemoglobin A1c) blood test. This provides information about blood glucose control over the previous 2-3 months.  An oral glucose tolerance test (OGTT). This measures your blood glucose at two times: ? After fasting. This is your baseline blood glucose level. ? Two hours after drinking a beverage that contains glucose.  You may be diagnosed with type 2 diabetes if:  Your FBG level is 126 mg/dL (7.0 mmol/L) or higher.  Your random blood glucose level is 200 mg/dL (11.1 mmol/L) or higher.  Your A1c level is 6.5% or higher.  Your OGGT result is higher than 200 mg/dL (11.1 mmol/L).  These blood tests may be repeated to confirm your diagnosis. How is this treated?  Your treatment may be managed by a specialist called an endocrinologist. Type 2 diabetes may be treated by following instructions from your health care provider about:  Making diet and lifestyle changes. This may include: ? Following an individualized nutrition plan that is developed by a diet and nutrition specialist (registered dietitian). ? Exercising regularly. ? Finding ways to manage stress.  Checking your blood glucose level as often as directed.  Taking diabetes medicines or insulin daily. This helps to keep your blood glucose levels in the healthy range. ? If you use insulin, you may need to adjust the dosage depending on how physically active you are and what foods you eat. Your health care provider will tell you how   dosage.  Taking medicines to help prevent complications from diabetes, such as:  Aspirin.  Medicine to lower cholesterol.  Medicine to control blood pressure. Your health care provider will set individualized treatment goals for you. Your goals will be based on your  age, other medical conditions you have, and how you respond to diabetes treatment. Generally, the goal of treatment is to maintain the following blood glucose levels:  Before meals (preprandial): 80-130 mg/dL (1.6-1.0 mmol/L).  After meals (postprandial): below 180 mg/dL (10 mmol/L).  A1c level: less than 7%. Follow these instructions at home: Questions to Ask Your Health Care Provider  Consider asking the following questions:  Do I need to meet with a diabetes educator?  Where can I find a support group for people with diabetes?  What equipment will I need to manage my diabetes at home?  What diabetes medicines do I need, and when should I take them?  How often do I need to check my blood glucose?  What number can I call if I have questions?  When is my next appointment? General instructions   Take over-the-counter and prescription medicines only as told by your health care provider.  Keep all follow-up visits as told by your health care provider. This is important.  For more information about diabetes, visit:  American Diabetes Association (ADA): www.diabetes.org  American Association of Diabetes Educators (AADE): www.diabeteseducator.org/patient-resources Contact a health care provider if:  Your blood glucose is at or above 240 mg/dL (96.0 mmol/L) for 2 days in a row.  You have been sick or have had a fever for 2 days or longer and you are not getting better.  You have any of the following problems for more than 6 hours:  You cannot eat or drink.  You have nausea and vomiting.  You have diarrhea. Get help right away if:  Your blood glucose is lower than 54 mg/dL (3.0 mmol/L).  You become confused or you have trouble thinking clearly.  You have difficulty breathing.  You have moderate or large ketone levels in your urine. This information is not intended to replace advice given to you by your health care provider. Make sure you discuss any questions you have  with your health care provider. Document Released: 04/21/2005 Document Revised: 09/27/2015 Document Reviewed: 05/25/2015 Elsevier Interactive Patient Education  2017 [employer]  Hypertension Hypertension, commonly called high blood pressure, is when the force of blood pumping through the arteries is too strong. The arteries are the blood vessels that carry blood from the heart throughout the body. Hypertension forces the heart to work harder to pump blood and may cause arteries to become narrow or stiff. Having untreated or uncontrolled hypertension can cause heart attacks, strokes, kidney disease, and other problems. A blood pressure reading consists of a higher number over a lower number. Ideally, your blood pressure should be below 120/80. The first ("top") number is called the systolic pressure. It is a measure of the pressure in your arteries as your heart beats. The second ("bottom") number is called the diastolic pressure. It is a measure of the pressure in your arteries as the heart relaxes. What are the causes? The cause of this condition is not known. What increases the risk? Some risk factors for high blood pressure are under your control. Others are not. Factors you can change   Smoking.  Having type 2 diabetes mellitus, high cholesterol, or both.  Not getting enough exercise or physical activity.  Being overweight.  Having too much  fat, sugar, calories, or salt (sodium) in your diet.  Drinking too much alcohol. Factors that are difficult or impossible to change   Having chronic kidney disease.  Having a family history of high blood pressure.  Age. Risk increases with age.  Race. You may be at higher risk if you are African-American.  Gender. Men are at higher risk than women before age 76. After age 85, women are at higher risk than men.  Having obstructive sleep apnea.  Stress. What are the signs or symptoms? Extremely high blood pressure (hypertensive  crisis) may cause:  Headache.  Anxiety.  Shortness of breath.  Nosebleed.  Nausea and vomiting.  Severe chest pain.  Jerky movements you cannot control (seizures). How is this diagnosed? This condition is diagnosed by measuring your blood pressure while you are seated, with your arm resting on a surface. The cuff of the blood pressure monitor will be placed directly against the skin of your upper arm at the level of your heart. It should be measured at least twice using the same arm. Certain conditions can cause a difference in blood pressure between your right and left arms. Certain factors can cause blood pressure readings to be lower or higher than normal (elevated) for a short period of time:  When your blood pressure is higher when you are in a health care provider's office than when you are at home, this is called white coat hypertension. Most people with this condition do not need medicines.  When your blood pressure is higher at home than when you are in a health care provider's office, this is called masked hypertension. Most people with this condition may need medicines to control blood pressure. If you have a high blood pressure reading during one visit or you have normal blood pressure with other risk factors:  You may be asked to return on a different day to have your blood pressure checked again.  You may be asked to monitor your blood pressure at home for 1 week or longer. If you are diagnosed with hypertension, you may have other blood or imaging tests to help your health care provider understand your overall risk for other conditions. How is this treated? This condition is treated by making healthy lifestyle changes, such as eating healthy foods, exercising more, and reducing your alcohol intake. Your health care provider may prescribe medicine if lifestyle changes are not enough to get your blood pressure under control, and if:  Your systolic blood pressure is above  130.  Your diastolic blood pressure is above 80. Your personal target blood pressure may vary depending on your medical conditions, your age, and other factors. Follow these instructions at home: Eating and drinking   Eat a diet that is high in fiber and potassium, and low in sodium, added sugar, and fat. An example eating plan is called the DASH (Dietary Approaches to Stop Hypertension) diet. To eat this way:  Eat plenty of fresh fruits and vegetables. Try to fill half of your plate at each meal with fruits and vegetables.  Eat whole grains, such as whole wheat pasta, brown rice, or whole grain bread. Fill about one quarter of your plate with whole grains.  Eat or drink low-fat dairy products, such as skim milk or low-fat yogurt.  Avoid fatty cuts of meat, processed or cured meats, and poultry with skin. Fill about one quarter of your plate with lean proteins, such as fish, chicken without skin, beans, eggs, and tofu.  Avoid premade  and processed foods. These tend to be higher in sodium, added sugar, and fat.  Reduce your daily sodium intake. Most people with hypertension should eat less than 1,500 mg of sodium a day.  Limit alcohol intake to no more than 1 drink a day for nonpregnant women and 2 drinks a day for men. One drink equals 12 oz of beer, 5 oz of wine, or 1 oz of hard liquor. Lifestyle   Work with your health care provider to maintain a healthy body weight or to lose weight. Ask what an ideal weight is for you.  Get at least 30 minutes of exercise that causes your heart to beat faster (aerobic exercise) most days of the week. Activities may include walking, swimming, or biking.  Include exercise to strengthen your muscles (resistance exercise), such as pilates or lifting weights, as part of your weekly exercise routine. Try to do these types of exercises for 30 minutes at least 3 days a week.  Do not use any products that contain nicotine or tobacco, such as cigarettes and  e-cigarettes. If you need help quitting, ask your health care provider.  Monitor your blood pressure at home as told by your health care provider.  Keep all follow-up visits as told by your health care provider. This is important. Medicines   Take over-the-counter and prescription medicines only as told by your health care provider. Follow directions carefully. Blood pressure medicines must be taken as prescribed.  Do not skip doses of blood pressure medicine. Doing this puts you at risk for problems and can make the medicine less effective.  Ask your health care provider about side effects or reactions to medicines that you should watch for. Contact a health care provider if:  You think you are having a reaction to a medicine you are taking.  You have headaches that keep coming back (recurring).  You feel dizzy.  You have swelling in your ankles.  You have trouble with your vision. Get help right away if:  You develop a severe headache or confusion.  You have unusual weakness or numbness.  You feel faint.  You have severe pain in your chest or abdomen.  You vomit repeatedly.  You have trouble breathing. Summary  Hypertension is when the force of blood pumping through your arteries is too strong. If this condition is not controlled, it may put you at risk for serious complications.  Your personal target blood pressure may vary depending on your medical conditions, your age, and other factors. For most people, a normal blood pressure is less than 120/80.  Hypertension is treated with lifestyle changes, medicines, or a combination of both. Lifestyle changes include weight loss, eating a healthy, low-sodium diet, exercising more, and limiting alcohol. This information is not intended to replace advice given to you by your health care provider. Make sure you discuss any questions you have with your health care provider. Document Released: 04/21/2005 Document Revised:  03/19/2016 Document Reviewed: 03/19/2016 Elsevier Interactive Patient Education  2017 [employer].

## 2016-08-19 NOTE — Progress Notes (Signed)
   Subjective:    Patient ID: Guy Gonzalez, male    DOB: 05/25/1970, 46 y.o.   MRN: [id number]  The patient presents for a medication refill for his Metformin.  The patient states he was not able to get an appointment with his PCP and also his new insurance has not taken effect, patient is a [employer] e'ee.  Patient has been out of his Metformin for one month.  Patient has had increased polyuria and also intermittent dizziness, HA's and blurred vision.  Patient states he was diagnosed with Type II DM in September, 2017 after losing 121lbs.  Patient has no complaints at this time.  Patient denies history of heart, lung, liver, kidney disease, seizures or abnormal bleeding disorders.  Patient's last AIC was 6.5 in December.  Patient's BP 132/90 today, patient unaware of elevated BP.       Review of Systems  Constitutional: Negative.   Respiratory: Negative.   Cardiovascular: Negative.   Gastrointestinal: Negative.   Endocrine: Positive for polyuria. Negative for polydipsia and polyphagia.  Skin: Negative.   Neurological: Positive for dizziness and headaches.       Blurred vision       Objective:   Physical Exam  Constitutional: He appears well-developed and well-nourished. No distress.  HENT:  Head: Normocephalic and atraumatic.  Eyes: Conjunctivae and EOM are normal. Pupils are equal, round, and reactive to light.  Neck: Normal range of motion. Neck supple. No thyromegaly present.  Cardiovascular: Normal rate, regular rhythm and normal heart sounds.   Pulmonary/Chest: Effort normal and breath sounds normal. No respiratory distress.  Abdominal: Soft. Bowel sounds are normal.  Neurological: He is alert. No cranial nerve deficit.  Skin: Skin is warm and dry.  Psychiatric: He has a normal mood and affect. His behavior is normal. Judgment and thought content normal.          Assessment & Plan:  Type II DM w/out Complication.  Patient will follow up in 2-3 weeks with PCP for [facility].   Reviewed indications of hyperglycemia with patient.  Patient will continue strict diet and exercise for glycemic control.  Patient will also start checking BP at least weekly.  Patient will return as needed.

## 2016-11-14 MED FILL — metFORMIN HCL 500 MG TABS: 500 | 90 days supply | Qty: 180 | Fill #1

## 2017-03-27 MED FILL — metFORMIN HCL 500 MG TABS: 500 | 90 days supply | Qty: 180 | Fill #2

## 2017-08-20 ENCOUNTER — Other Ambulatory Visit: Payer: Self-pay

## 2017-08-20 ENCOUNTER — Encounter (HOSPITAL_COMMUNITY): Payer: Self-pay | Admitting: Emergency Medicine

## 2017-08-20 ENCOUNTER — Ambulatory Visit (HOSPITAL_COMMUNITY)
Admission: EM | Admit: 2017-08-20 | Discharge: 2017-08-20 | Disposition: A | Discharge status: Home / Self Care | Payer: BLUE CROSS/BLUE SHIELD | Attending: Family Medicine | Admitting: Family Medicine

## 2017-08-20 DIAGNOSIS — J069 Acute upper respiratory infection, unspecified: Secondary | ICD-10-CM

## 2017-08-20 DIAGNOSIS — B9789 Other viral agents as the cause of diseases classified elsewhere: Secondary | ICD-10-CM | POA: Diagnosis not present

## 2017-08-20 MED ORDER — PREDNISONE 10 MG PO TABS: 40.0000 mg | ORAL_TABLET | Freq: Every day | ORAL | 0 refills | Status: AC

## 2017-08-20 MED ORDER — AZITHROMYCIN 250 MG PO TABS: 250.0000 mg | ORAL_TABLET | Freq: Every day | ORAL | 0 refills | Status: DC

## 2017-08-20 NOTE — ED Triage Notes (Signed)
Patient reports being sick 3-4 days.  Initially had sneezing.  Intermittent chest congestion, sneezing, headache

## 2017-08-20 NOTE — ED Provider Notes (Signed)
[facility]    CSN: [id number] Arrival date & time: 08/20/17  1724     History   Chief Complaint Chief Complaint  Patient presents with  . URI    HPI Donna BernardScott L Schroth is a 47 y.o. male.   47 year old male, with history of diabetes, presenting today complaining of cold symptoms.  Patient states that he has had nasal congestion, postnasal drip, sore throat, cough and sinus pressure for the past 4 days.  Symptoms have been gradually worsening since the onset.  He denies any associated fever or chills.  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  Has been exposed to several recent sick contacts.  Denies any headache, neck pain or stiffness, chest pain, shortness of breath.  The history is provided by the patient.  URI  Presenting symptoms: congestion, cough, facial pain, rhinorrhea and sore throat   Presenting symptoms: no ear pain and no fever   Severity:  Moderate Onset quality:  Gradual Duration:  4 days Timing:  Constant Progression:  Worsening Chronicity:  New Relieved by:  Nothing Worsened by:  Nothing Ineffective treatments:  None tried Associated symptoms: sinus pain   Associated symptoms: no arthralgias, no headaches, no myalgias, no neck pain, no sneezing, no swollen glands and no wheezing   Risk factors: diabetes mellitus and sick contacts   Risk factors: not elderly, no chronic cardiac disease, no chronic kidney disease and no chronic respiratory disease     Past Medical History:  Diagnosis Date  . Diabetes mellitus without complication [facility](HCC)     Patient Active Problem List   Diagnosis Date Noted  . HYPERGLYCEMIA 11/13/2008  . OBESITY 04/06/2008  . Allergic rhinitis, cause unspecified 04/06/2008  . ELEVATED BLOOD PRESSURE WITHOUT DIAGNOSIS OF HYPERTENSION 04/06/2008    History reviewed. No pertinent surgical history.     Home Medications    Prior to Admission medications   Medication Sig Start  Date End Date Taking? Authorizing Provider  azithromycin (ZITHROMAX) 250 MG tablet Take 1 tablet (250 mg total) by mouth daily. Take first 2 tablets together, then 1 every day until finished. 08/20/17   Nanetta Wiegman C, PA-C  cetirizine (ZYRTEC ALLERGY) 10 MG tablet Take 1 tablet (10 mg total) by mouth daily. 07/02/16   Everlene Farrieransie, William, PA-C  FARXIGA 10 MG TABS tablet Take 10 mg by mouth daily. 08/01/16   [provider]  fluticasone (FLONASE) 50 MCG/ACT nasal spray Place 2 sprays into both nostrils daily. 07/02/16   Everlene Farrieransie, William, PA-C  metFORMIN (GLUCOPHAGE) 500 MG tablet Take 1,000 mg by mouth 2 (two) times daily with a meal.    [provider]  metFORMIN (GLUCOPHAGE) 500 MG tablet Take 2 tablets (1,000 mg total) by mouth 2 (two) times daily with a meal. 08/19/16 09/18/16  Benay PikeLeath, Christie Janell, NP  predniSONE (DELTASONE) 10 MG tablet Take 4 tablets (40 mg total) by mouth daily for 5 days. 08/20/17 08/25/17  Alecia LemmingBlue, [place] Filice C, PA-C    Family History History reviewed. No pertinent family history.  Social History Social History   Tobacco Use  . Smoking status: Former Smoker    Types: Cigarettes    Last attempt to quit: 05/11/2000    Years since quitting: 17.2  . Smokeless tobacco: Never Used  Substance Use Topics  . Alcohol use: Yes    Comment: socially  . Drug use: No     Allergies   Cephalexin and Penicillins   Review of Systems Review of  Systems  Constitutional: Negative for chills and fever.  HENT: Positive for congestion, rhinorrhea, sinus pain and sore throat. Negative for ear pain and sneezing.   Eyes: Negative for pain and visual disturbance.  Respiratory: Positive for cough. Negative for shortness of breath and wheezing.   Cardiovascular: Negative for chest pain and palpitations.  Gastrointestinal: Negative for abdominal pain and vomiting.  Genitourinary: Negative for dysuria and hematuria.  Musculoskeletal: Negative for arthralgias, back pain, myalgias and  neck pain.  Skin: Negative for color change and rash.  Neurological: Negative for seizures, syncope and headaches.  All other systems reviewed and are negative.    Physical Exam Triage Vital Signs ED Triage Vitals  Enc Vitals Group     BP 08/20/17 1818 (!) 143/102     Pulse Rate 08/20/17 1818 88     Resp 08/20/17 1818 20     Temp 08/20/17 1818 98.1 F (36.7 C)     Temp Source 08/20/17 1818 Oral     SpO2 08/20/17 1818 100 %     Weight --      Height --      Head Circumference --      Peak Flow --      Pain Score 08/20/17 1816 2     Pain Loc --      Pain Edu? --      Excl. in GC? --    No data found.  Updated Vital Signs BP (!) 143/102 (BP Location: Left Arm)   Pulse 88   Temp 98.1 F (36.7 C) (Oral)   Resp 20   SpO2 100%   Visual Acuity Right Eye Distance:   Left Eye Distance:   Bilateral Distance:    Right Eye Near:   Left Eye Near:    Bilateral Near:     Physical Exam  Constitutional: He appears well-developed and well-nourished.  HENT:  Head: Normocephalic and atraumatic.  Right Ear: Hearing, tympanic membrane, external ear and ear canal normal.  Left Ear: Hearing, tympanic membrane, external ear and ear canal normal.  Nose: Right sinus exhibits maxillary sinus tenderness and frontal sinus tenderness. Left sinus exhibits maxillary sinus tenderness and frontal sinus tenderness.  Mouth/Throat: Uvula is midline and oropharynx is clear and moist. No oropharyngeal exudate, posterior oropharyngeal edema, posterior oropharyngeal erythema or tonsillar abscesses.  Eyes: Conjunctivae are normal.  Neck: Neck supple.  Cardiovascular: Normal rate and regular rhythm.  No murmur heard. Pulmonary/Chest: Effort normal and breath sounds normal. No stridor. No respiratory distress. He has no decreased breath sounds. He has no wheezes. He has no rhonchi. He has no rales.  Abdominal: Soft. There is no tenderness.  Musculoskeletal: He exhibits no edema.  Neurological: He is  alert.  Skin: Skin is warm and dry.  Psychiatric: He has a normal mood and affect.  Nursing note and vitals reviewed.    UC Treatments / Results  Labs (all labs ordered are listed, but only abnormal results are displayed) Labs Reviewed - No data to display  EKG None Radiology No results found.  Procedures Procedures (including critical care time)  Medications Ordered in UC Medications - No data to display   Initial Impression / Assessment and Plan / UC Course  I have reviewed the triage vital signs and the nursing notes.  Pertinent labs & imaging results that were available during my care of the patient were reviewed by me and considered in my medical decision making (see chart for details).     Upper respiratory infection.  Patient does have symptoms of sinusitis.  His symptoms have only been ongoing for the past 4 days.  Recommended prednisone and symptomatic treatment.  Given prescription for antibiotic if his symptoms do not improve in the next 2 to 3 days.  Final Clinical Impressions(s) / UC Diagnoses   Final diagnoses:  Viral URI with cough    ED Discharge Orders        Ordered    predniSONE (DELTASONE) 10 MG tablet  Daily     08/20/17 1823    azithromycin (ZITHROMAX) 250 MG tablet  Daily     08/20/17 1823       Controlled Substance Prescriptions [place] Controlled Substance Registry consulted? Not Applicable   Alecia Lemming, [place] 08/20/17 1610

## 2017-10-19 MED FILL — DESONIDE 0.05% CREAM: 0.05 | 30 days supply | Qty: 60 | Fill #0

## 2017-10-22 ENCOUNTER — Encounter: Payer: Self-pay | Admitting: Emergency Medicine

## 2017-10-22 ENCOUNTER — Other Ambulatory Visit: Payer: Self-pay

## 2017-10-22 ENCOUNTER — Emergency Department
Admission: EM | Admit: 2017-10-22 | Discharge: 2017-10-22 | Disposition: A | Discharge status: Home / Self Care | Payer: BLUE CROSS/BLUE SHIELD | Source: Home / Self Care | Attending: Family Medicine | Admitting: Family Medicine

## 2017-10-22 DIAGNOSIS — I10 Essential (primary) hypertension: Secondary | ICD-10-CM

## 2017-10-22 DIAGNOSIS — R030 Elevated blood-pressure reading, without diagnosis of hypertension: Secondary | ICD-10-CM

## 2017-10-22 DIAGNOSIS — J019 Acute sinusitis, unspecified: Secondary | ICD-10-CM | POA: Diagnosis not present

## 2017-10-22 DIAGNOSIS — J069 Acute upper respiratory infection, unspecified: Secondary | ICD-10-CM

## 2017-10-22 DIAGNOSIS — Z760 Encounter for issue of repeat prescription: Secondary | ICD-10-CM

## 2017-10-22 DIAGNOSIS — R7309 Other abnormal glucose: Secondary | ICD-10-CM

## 2017-10-22 LAB — POCT CBC W AUTO DIFF (K'VILLE URGENT CARE)

## 2017-10-22 MED ORDER — METFORMIN HCL 500 MG PO TABS: 1000.0000 mg | ORAL_TABLET | Freq: Two times a day (BID) | ORAL | 0 refills | Status: DC

## 2017-10-22 MED ORDER — HYDROCHLOROTHIAZIDE 12.5 MG PO TABS: 12.5000 mg | ORAL_TABLET | Freq: Every day | ORAL | 0 refills | Status: DC

## 2017-10-22 MED ORDER — DOXYCYCLINE HYCLATE 100 MG PO CAPS: 100.0000 mg | ORAL_CAPSULE | Freq: Two times a day (BID) | ORAL | 0 refills | Status: AC

## 2017-10-22 MED FILL — metFORMIN HCL 500 MG TABS: 500 | 30 days supply | Qty: 120 | Fill #0

## 2017-10-22 MED FILL — DOXYCYCLINE HYCLATE 100 MG: 100 | 10 days supply | Qty: 20 | Fill #0

## 2017-10-22 MED FILL — HYDROCHLOROTHIAZIDE 12.5 MG: 12.5 | 30 days supply | Qty: 30 | Fill #0

## 2017-10-22 NOTE — ED Triage Notes (Signed)
Nasal congestion, runny nose, hoarseness x 2 months was given meds and got better for a few days, never got well.

## 2017-10-22 NOTE — ED Notes (Signed)
Blood draw attempted and successful by myself (C Letti Towell), left AC. K Bowman at bedside for assistance.

## 2017-10-22 NOTE — ED Provider Notes (Signed)
[facility]    CSN: [id number] Arrival date & time: 10/22/17  1443     History   Chief Complaint Chief Complaint  Patient presents with  . Nasal Congestion    HPI SHADEED COLBERG is a 47 y.o. male.   HPI  ALISHA BURGO is a 47 y.o. male presenting to UC with c/o 2 months of intermittent, gradually worsening nasal congestion, sinus pain and pressure, rhinorrhea, mild cough and hoarse voice. Cough is productive at times with green sputum and white nasal discharge. He was seen at an urgent care on 08/20/17 and completed a course of Prednisone as well as Azithromycin but was only better for about 1-2 days.  Denies fever, chills, n/v/d.   BP elevated in triage. No prior hx of HTN but he does recall his BP was elevated at his last UC visit. He does have a family hx of HTN.  Denies HA, dizziness, chest pain or SOB.  He does not currently have a PCP because his recently retired but he is currently looking for a new PCP.  He is requesting a refill of his metformin.    Past Medical History:  Diagnosis Date  . Diabetes mellitus without complication [facility])     Patient Active Problem List   Diagnosis Date Noted  . HYPERGLYCEMIA 11/13/2008  . OBESITY 04/06/2008  . Allergic rhinitis, cause unspecified 04/06/2008  . ELEVATED BLOOD PRESSURE WITHOUT DIAGNOSIS OF HYPERTENSION 04/06/2008    History reviewed. No pertinent surgical history.     Home Medications    Prior to Admission medications   Medication Sig Start Date End Date Taking? Authorizing Provider  cetirizine (ZYRTEC ALLERGY) 10 MG tablet Take 1 tablet (10 mg total) by mouth daily. 07/02/16   Everlene Farrier, PA-C  doxycycline (VIBRAMYCIN) 100 MG capsule Take 1 capsule (100 mg total) by mouth 2 (two) times daily for 10 days. One po bid x 7 days 10/22/17 11/01/17  Lurene Shadow, PA-C  fluticasone [facility]) 50 MCG/ACT nasal spray Place 2 sprays into both nostrils daily. 07/02/16   Everlene Farrier, PA-C    hydrochlorothiazide (HYDRODIURIL) 12.5 MG tablet Take 1 tablet (12.5 mg total) by mouth daily. 10/22/17   Lurene Shadow, PA-C  metFORMIN (GLUCOPHAGE) 500 MG tablet Take 2 tablets (1,000 mg total) by mouth 2 (two) times daily with a meal. 10/22/17 11/21/17  Lurene Shadow, PA-C    Family History No family history on file.  Social History Social History   Tobacco Use  . Smoking status: Former Smoker    Types: Cigarettes    Last attempt to quit: 05/11/2000    Years since quitting: 17.4  . Smokeless tobacco: Never Used  Substance Use Topics  . Alcohol use: Yes    Comment: socially  . Drug use: No     Allergies   Cephalexin and Penicillins   Review of Systems Review of Systems  Constitutional: Negative for chills and fever.  HENT: Positive for congestion, rhinorrhea, sinus pressure, sinus pain and voice change. Negative for ear pain, postnasal drip, sore throat and trouble swallowing.   Respiratory: Positive for cough. Negative for shortness of breath.   Cardiovascular: Negative for chest pain and palpitations.  Gastrointestinal: Negative for abdominal pain, diarrhea, nausea and vomiting.  Musculoskeletal: Negative for arthralgias, back pain and myalgias.  Skin: Negative for rash.  Neurological: Negative for dizziness, light-headedness and headaches.     Physical Exam Triage Vital Signs ED Triage Vitals  Enc Vitals Group  BP      Pulse      Resp      Temp      Temp src      SpO2      Weight      Height      Head Circumference      Peak Flow      Pain Score      Pain Loc      Pain Edu?      Excl. in GC?    No data found.  Updated Vital Signs BP (!) 164/109 (BP Location: Right Arm)   Pulse (!) 101   Temp 98.3 F (36.8 C) (Oral)   Ht 6\' 4"  (1.93 m)   Wt (!) 339 lb (153.8 kg)   SpO2 96%   BMI 41.26 kg/m   Visual Acuity Right Eye Distance:   Left Eye Distance:   Bilateral Distance:    Right Eye Near:   Left Eye Near:    Bilateral Near:      Physical Exam  Constitutional: He is oriented to person, place, and time. He appears well-developed and well-nourished. No distress.  HENT:  Head: Normocephalic and atraumatic.  Right Ear: Tympanic membrane normal.  Left Ear: Tympanic membrane normal.  Nose: Mucosal edema present. Right sinus exhibits maxillary sinus tenderness and frontal sinus tenderness. Left sinus exhibits maxillary sinus tenderness and frontal sinus tenderness.  Mouth/Throat: Uvula is midline, oropharynx is clear and moist and mucous membranes are normal.  Eyes: EOM are normal.  Neck: Normal range of motion. Neck supple.  Cardiovascular: Normal rate and regular rhythm.  Pulmonary/Chest: Effort normal and breath sounds normal. No stridor. No respiratory distress. He has no wheezes. He has no rales.  Musculoskeletal: Normal range of motion.  Lymphadenopathy:    He has no cervical adenopathy.  Neurological: He is alert and oriented to person, place, and time.  Skin: Skin is warm and dry. He is not diaphoretic.  Psychiatric: He has a normal mood and affect. His behavior is normal.  Nursing note and vitals reviewed.    UC Treatments / Results  Labs (all labs ordered are listed, but only abnormal results are displayed) Labs Reviewed  COMPLETE METABOLIC PANEL WITH GFR  TSH  POCT CBC W AUTO DIFF ([facility])    EKG None  Radiology No results found.  Procedures Procedures (including critical care time)  Medications Ordered in UC Medications - No data to display  Initial Impression / Assessment and Plan / UC Course  I have reviewed the triage vital signs and the nursing notes.  Pertinent labs & imaging results that were available during my care of the patient were reviewed by me and considered in my medical decision making (see chart for details).    Pt initially presenting for persistent URI symptoms.  Will tx with doxycycline, pt allergic to PCN. Metformin refilled. BP elevated last visit  at [facility] on 4/18 as well as today and reports family hx of HTN, will start on low dose HCTZ. BP in UC 184/128 in triage, improved to 164/109 at recheck Home instructions provided as well as resource guide to help establish care with a PCP within the next 4 weeks.   Final Clinical Impressions(s) / UC Diagnoses   Final diagnoses:  Acute rhinosinusitis  Upper respiratory tract infection, unspecified type  Essential hypertension  Medication refill     Discharge Instructions      Please be sure to establish care with a family doctor  so they can manage your diabetes and high blood pressure.  It is important to get your blood pressure checked within the next 1 week to make sure the medication is working well for you. Please see additional information in this packet about high blood pressure.  The prednisone you took recently for your sinus and cold symptoms could have caused it to also be more elevated than usual.   Please follow up with family medicine or return to urgent care if your sinus pain and cough/congestion is not improving in 7-10 days.     ED Prescriptions    Medication Sig Dispense Auth. Provider   doxycycline (VIBRAMYCIN) 100 MG capsule Take 1 capsule (100 mg total) by mouth 2 (two) times daily for 10 days. One po bid x 10 days 20 capsule Rosibel Giacobbe O, PA-C   hydrochlorothiazide (HYDRODIURIL) 12.5 MG tablet Take 1 tablet (12.5 mg total) by mouth daily. 30 tablet Doroteo GlassmanPhelps, Avaleen Brownley O, PA-C   metFORMIN (GLUCOPHAGE) 500 MG tablet Take 2 tablets (1,000 mg total) by mouth 2 (two) times daily with a meal. 120 tablet Lurene ShadowPhelps, Kenzlee Fishburn O, PA-C     Controlled Substance Prescriptions [place] Controlled Substance Registry consulted? Not Applicable   Rolla Platehelps, Carmyn Hamm O, PA-C 10/22/17 [phone]

## 2017-10-22 NOTE — Discharge Instructions (Signed)
°  Please be sure to establish care with a family doctor so they can manage your diabetes and high blood pressure.  It is important to get your blood pressure checked within the next 1 week to make sure the medication is working well for you. Please see additional information in this packet about high blood pressure.  The prednisone you took recently for your sinus and cold symptoms could have caused it to also be more elevated than usual.   Please follow up with family medicine or return to urgent care if your sinus pain and cough/congestion is not improving in 7-10 days.

## 2017-10-23 LAB — COMPLETE METABOLIC PANEL WITH GFR
AG Ratio: 1.5 (calc) (ref 1.0–2.5)
ALT: 20 U/L (ref 9–46)
AST: 22 U/L (ref 10–40)
Albumin: 4.2 g/dL (ref 3.6–5.1)
Alkaline phosphatase (APISO): 68 U/L (ref 40–115)
BUN: 10 mg/dL (ref 7–25)
CO2: 25 mmol/L (ref 20–32)
Calcium: 9.3 mg/dL (ref 8.6–10.3)
Chloride: 103 mmol/L (ref 98–110)
Creat: 1 mg/dL (ref 0.60–1.35)
GFR, Est African American: 105 mL/min/{1.73_m2} (ref >=60)
GFR, Est Non African American: 90 mL/min/{1.73_m2} (ref >=60)
Globulin: 2.8 g/dL (calc) (ref 1.9–3.7)
Glucose, Bld: 212 mg/dL — ABNORMAL HIGH (ref 65–99)
Potassium: 4 mmol/L (ref 3.5–5.3)
Sodium: 137 mmol/L (ref 135–146)
Total Bilirubin: 0.6 mg/dL (ref 0.2–1.2)
Total Protein: 7 g/dL (ref 6.1–8.1)

## 2017-10-23 LAB — EXTRA LAV TOP TUBE

## 2017-10-23 LAB — TSH: TSH: 1.55 mIU/L (ref 0.40–4.50)

## 2018-04-26 ENCOUNTER — Encounter (HOSPITAL_COMMUNITY): Payer: Self-pay | Admitting: Emergency Medicine

## 2018-04-26 ENCOUNTER — Other Ambulatory Visit: Payer: Self-pay

## 2018-04-26 ENCOUNTER — Ambulatory Visit (HOSPITAL_COMMUNITY)
Admission: EM | Admit: 2018-04-26 | Discharge: 2018-04-26 | Disposition: A | Discharge status: Home / Self Care | Payer: BLUE CROSS/BLUE SHIELD | Attending: Internal Medicine | Admitting: Internal Medicine

## 2018-04-26 DIAGNOSIS — B349 Viral infection, unspecified: Secondary | ICD-10-CM | POA: Diagnosis not present

## 2018-04-26 DIAGNOSIS — Z760 Encounter for issue of repeat prescription: Secondary | ICD-10-CM | POA: Insufficient documentation

## 2018-04-26 LAB — GLUCOSE, CAPILLARY: Glucose-Capillary: 382 mg/dL — ABNORMAL HIGH (ref 70–99)

## 2018-04-26 MED ORDER — BENZONATATE 100 MG PO CAPS: 100.0000 mg | ORAL_CAPSULE | Freq: Three times a day (TID) | ORAL | 0 refills | Status: DC

## 2018-04-26 MED ORDER — METFORMIN HCL 500 MG PO TABS: 1000.0000 mg | ORAL_TABLET | Freq: Two times a day (BID) | ORAL | 1 refills | Status: DC

## 2018-04-26 NOTE — Discharge Instructions (Addendum)
I believe you have a viral illness We will do Tessalon Perles for cough You can do Mucinex over-the-counter for congestion and cough I have refilled your metformin Your blood sugar was over 300 I recommend that you start back your metformin and make sure you are staying hydrated and drinking plenty of fluids Keep monitoring your blood sugars, try to avoid sugary foods, carbohydrates and medicines with sugar in them You can try switching from your Zyrtec to maybe a Claritin or Allegra to see if this helps better with the drainage in your throat and hoarseness. If your symptoms continue you may want to follow-up with an ear nose and throat physician for further evaluation

## 2018-04-26 NOTE — ED Triage Notes (Signed)
Uri symptoms.  Onset of symptoms was 4-5 days ago.  Patient is coughing, stuffy nose, headaches, pressure behind eyes, and groggy and hoarseness

## 2018-04-30 NOTE — ED Provider Notes (Signed)
[facility]    CSN: [id number] Arrival date & time: 04/26/18  1942     History   Chief Complaint Chief Complaint  Patient presents with  . URI    HPI Guy Gonzalez is a 47 y.o. male.   Pt is a 47 year old male with PMH of diabetes. He presents with cough, congestion, rhinorrhea, hoarseness x 5 days. Symptoms have been constant and remained the same. He has been taking OTC cough and cold medication for the symptoms. These have somewhat helped but increased his blood sugars. He has had some fatigue due to elevated blood sugars. He has not had his metformin in a few days. He recently moved here and has not found a PCP yet. He denies any associated fevers, chills, nausea, vomiting, diarrhea. No abdominal pain, polydipsia, polyphagia, polyuria.   ROS per HPI    URI    Past Medical History:  Diagnosis Date  . Diabetes mellitus without complication [facility])     Patient Active Problem List   Diagnosis Date Noted  . HYPERGLYCEMIA 11/13/2008  . OBESITY 04/06/2008  . Allergic rhinitis, cause unspecified 04/06/2008  . ELEVATED BLOOD PRESSURE WITHOUT DIAGNOSIS OF HYPERTENSION 04/06/2008    History reviewed. No pertinent surgical history.     Home Medications    Prior to Admission medications   Medication Sig Start Date End Date Taking? Authorizing Provider  benzonatate (TESSALON) 100 MG capsule Take 1 capsule (100 mg total) by mouth every 8 (eight) hours. 04/26/18   Dahlia Byes A, NP  cetirizine (ZYRTEC ALLERGY) 10 MG tablet Take 1 tablet (10 mg total) by mouth daily. 07/02/16   Everlene Farrier, PA-C  fluticasone (FLONASE) 50 MCG/ACT nasal spray Place 2 sprays into both nostrils daily. 07/02/16   Everlene Farrier, PA-C  hydrochlorothiazide (HYDRODIURIL) 12.5 MG tablet Take 1 tablet (12.5 mg total) by mouth daily. 10/22/17   Lurene Shadow, PA-C  metFORMIN (GLUCOPHAGE) 500 MG tablet Take 2 tablets (1,000 mg total) by mouth 2 (two) times daily with a meal. 04/26/18  06/25/18  Janace Aris, NP    Family History Family History  Problem Relation Age of Onset  . Diabetes Father   . Heart failure Father     Social History Social History   Tobacco Use  . Smoking status: Former Smoker    Types: Cigarettes    Last attempt to quit: 05/11/2000    Years since quitting: 17.9  . Smokeless tobacco: Never Used  Substance Use Topics  . Alcohol use: Yes    Comment: socially  . Drug use: No     Allergies   Cephalexin and Penicillins   Review of Systems Review of Systems   Physical Exam Triage Vital Signs ED Triage Vitals  Enc Vitals Group     BP 04/26/18 2101 (!) 148/93     Pulse Rate 04/26/18 2101 97     Resp 04/26/18 2101 16     Temp 04/26/18 2101 98.8 F (37.1 C)     Temp Source 04/26/18 2101 Oral     SpO2 04/26/18 2101 98 %     Weight --      Height --      Head Circumference --      Peak Flow --      Pain Score 04/26/18 2102 4     Pain Loc --      Pain Edu? --      Excl. in GC? --    No data found.  Updated Vital Signs BP (!) 148/93 (BP Location: Right Arm)   Pulse 97   Temp 98.8 F (37.1 C) (Oral)   Resp 16   SpO2 98%   Visual Acuity Right Eye Distance:   Left Eye Distance:   Bilateral Distance:    Right Eye Near:   Left Eye Near:    Bilateral Near:     Physical Exam Vitals signs and nursing note reviewed.  Constitutional:      General: He is not in acute distress.    Appearance: Normal appearance. He is not ill-appearing, toxic-appearing or diaphoretic.  HENT:     Head: Normocephalic and atraumatic.     Right Ear: Tympanic membrane, ear canal and external ear normal.     Left Ear: Tympanic membrane, ear canal and external ear normal.     Nose: Rhinorrhea present. No congestion.     Mouth/Throat:     Pharynx: Oropharynx is clear.     Comments: PND Cardiovascular:     Rate and Rhythm: Normal rate and regular rhythm.     Heart sounds: Normal heart sounds.  Pulmonary:     Effort: Pulmonary effort is  normal.     Breath sounds: Normal breath sounds.  Abdominal:     General: Bowel sounds are normal.     Palpations: Abdomen is soft.     Tenderness: There is no abdominal tenderness.  Musculoskeletal: Normal range of motion.  Skin:    General: Skin is warm and dry.  Neurological:     General: No focal deficit present.     Mental Status: He is alert.  Psychiatric:        Mood and Affect: Mood normal.      UC Treatments / Results  Labs (all labs ordered are listed, but only abnormal results are displayed) Labs Reviewed  GLUCOSE, CAPILLARY - Abnormal; Notable for the following components:      Result Value   Glucose-Capillary 382 (*)    All other components within normal limits    EKG None  Radiology No results found.  Procedures Procedures (including critical care time)  Medications Ordered in UC Medications - No data to display  Initial Impression / Assessment and Plan / UC Course  I have reviewed the triage vital signs and the nursing notes.  Pertinent labs & imaging results that were available during my care of the patient were reviewed by me and considered in my medical decision making (see chart for details).     Most likely viral illness Pt blood sugar elevated today at 382  He admits to taking cough medication with a lot of sugar and not taking his metformin in a fea days.  We will refill metformin today and have him increase water intake.  Instructed to watch diet and avoid high sugar and carbs.  The horseness has been chronic and his zyrtec isn't working with allergy symptoms. We will have him switch to a different antihistamine to see if this helps.  mucinex for cough and congestion and tessalon pearls for cough.  Contact given for a PCP for continued care.  Also contact  given for ENT if the hoarseness continues.  Final Clinical Impressions(s) / UC Diagnoses   Final diagnoses:  Viral syndrome  Medication refill     Discharge Instructions     I  believe you have a viral illness We will do Tessalon Perles for cough You can do Mucinex over-the-counter for congestion and cough I have refilled your metformin Your  blood sugar was over 300 I recommend that you start back your metformin and make sure you are staying hydrated and drinking plenty of fluids Keep monitoring your blood sugars, try to avoid sugary foods, carbohydrates and medicines with sugar in them You can try switching from your Zyrtec to maybe a Claritin or Allegra to see if this helps better with the drainage in your throat and hoarseness. If your symptoms continue you may want to follow-up with an ear nose and throat physician for further evaluation     ED Prescriptions    Medication Sig Dispense Auth. Provider   metFORMIN (GLUCOPHAGE) 500 MG tablet Take 2 tablets (1,000 mg total) by mouth 2 (two) times daily with a meal. 120 tablet Zaine Elsass A, NP   benzonatate (TESSALON) 100 MG capsule Take 1 capsule (100 mg total) by mouth every 8 (eight) hours. 21 capsule Dahlia ByesBast, Jailyne Chieffo A, NP     Controlled Substance Prescriptions [place] Controlled Substance Registry consulted? no   Janace ArisBast, Adi Doro A, NP 04/30/18 1330

## 2018-05-09 ENCOUNTER — Encounter (HOSPITAL_COMMUNITY): Payer: Self-pay | Admitting: Emergency Medicine

## 2018-05-09 ENCOUNTER — Ambulatory Visit (HOSPITAL_COMMUNITY)
Admission: EM | Admit: 2018-05-09 | Discharge: 2018-05-09 | Disposition: A | Discharge status: Home / Self Care | Payer: BLUE CROSS/BLUE SHIELD | Attending: Physician Assistant | Admitting: Physician Assistant

## 2018-05-09 ENCOUNTER — Other Ambulatory Visit: Payer: Self-pay

## 2018-05-09 DIAGNOSIS — H1031 Unspecified acute conjunctivitis, right eye: Secondary | ICD-10-CM | POA: Insufficient documentation

## 2018-05-09 DIAGNOSIS — R0981 Nasal congestion: Secondary | ICD-10-CM | POA: Insufficient documentation

## 2018-05-09 MED ORDER — DOXYCYCLINE HYCLATE 100 MG PO CAPS: 100.0000 mg | ORAL_CAPSULE | Freq: Two times a day (BID) | ORAL | 0 refills | Status: DC

## 2018-05-09 MED ORDER — IPRATROPIUM BROMIDE 0.06 % NA SOLN: 2.0000 | Freq: Four times a day (QID) | NASAL | 0 refills | Status: DC

## 2018-05-09 MED ORDER — OFLOXACIN 0.3 % OP SOLN: 1.0000 [drp] | Freq: Four times a day (QID) | OPHTHALMIC | 0 refills | Status: AC

## 2018-05-09 NOTE — ED Provider Notes (Signed)
[facility]    CSN: [id number] Arrival date & time: 05/09/18  1101     History   Chief Complaint Chief Complaint  Patient presents with  . Conjunctivitis    right    HPI Guy Gonzalez is a 48 y.o. male.   48 year old male comes in for continued URI symptoms for the past 2 weeks and 3 day history of right eye redness/drainage. States was treated with antibiotics 2 weeks ago, but still having rhinorrhea, nasal congestion, cough. He denies fever, chills, night sweats. He does not recall name of antibiotics. For the past 3 days, has had right eye redness and drainage. Has crusting in the morning. Denies photophobia, vision changes. Denies contact lens, glasses use. Tried otc eye drops without relief.      Past Medical History:  Diagnosis Date  . Diabetes mellitus without complication [facility])     Patient Active Problem List   Diagnosis Date Noted  . HYPERGLYCEMIA 11/13/2008  . OBESITY 04/06/2008  . Allergic rhinitis, cause unspecified 04/06/2008  . ELEVATED BLOOD PRESSURE WITHOUT DIAGNOSIS OF HYPERTENSION 04/06/2008    History reviewed. No pertinent surgical history.     Home Medications    Prior to Admission medications   Medication Sig Start Date End Date Taking? Authorizing Provider  benzonatate (TESSALON) 100 MG capsule Take 1 capsule (100 mg total) by mouth every 8 (eight) hours. 04/26/18  Yes Bast, Traci A, NP  cetirizine (ZYRTEC ALLERGY) 10 MG tablet Take 1 tablet (10 mg total) by mouth daily. 07/02/16  Yes Everlene Farrier, PA-C  fluticasone (FLONASE) 50 MCG/ACT nasal spray Place 2 sprays into both nostrils daily. 07/02/16  Yes Everlene Farrier, PA-C  hydrochlorothiazide (HYDRODIURIL) 12.5 MG tablet Take 1 tablet (12.5 mg total) by mouth daily. 10/22/17  Yes Phelps, Vangie Bicker, PA-C  metFORMIN (GLUCOPHAGE) 500 MG tablet Take 2 tablets (1,000 mg total) by mouth 2 (two) times daily with a meal. 04/26/18 06/25/18 Yes Bast, Traci A, NP  doxycycline (VIBRAMYCIN) 100  MG capsule Take 1 capsule (100 mg total) by mouth 2 (two) times daily. 05/09/18   Cathie Hoops, Cameron Katayama V, PA-C  ipratropium (ATROVENT) 0.06 % nasal spray Place 2 sprays into both nostrils 4 (four) times daily. 05/09/18   Cathie Hoops, Leeah Politano V, PA-C  ofloxacin (OCUFLOX) 0.3 % ophthalmic solution Place 1 drop into the right eye 4 (four) times daily for 7 days. 05/09/18 05/16/18  Belinda Fisher, PA-C    Family History Family History  Problem Relation Age of Onset  . Diabetes Father   . Heart failure Father     Social History Social History   Tobacco Use  . Smoking status: Former Smoker    Types: Cigarettes    Last attempt to quit: 05/11/2000    Years since quitting: 18.0  . Smokeless tobacco: Never Used  Substance Use Topics  . Alcohol use: Yes    Comment: socially  . Drug use: No     Allergies   Cephalexin and Penicillins   Review of Systems Review of Systems  Reason unable to perform ROS: See HPI as above.     Physical Exam Triage Vital Signs ED Triage Vitals  Enc Vitals Group     BP 05/09/18 1155 139/90     Pulse Rate 05/09/18 1155 82     Resp --      Temp 05/09/18 1155 98.2 F (36.8 C)     Temp Source 05/09/18 1155 Oral     SpO2 05/09/18 1155 96 %  Weight --      Height --      Head Circumference --      Peak Flow --      Pain Score 05/09/18 1154 0     Pain Loc --      Pain Edu? --      Excl. in GC? --    No data found.  Updated Vital Signs BP 139/90 (BP Location: Right Arm)   Pulse 82   Temp 98.2 F (36.8 C) (Oral)   SpO2 96%   Visual Acuity Right Eye Distance: 20/20 Left Eye Distance: 20/20 Bilateral Distance: 20/20  Right Eye Near:   Left Eye Near:    Bilateral Near:     Physical Exam Constitutional:      General: He is not in acute distress.    Appearance: He is well-developed. He is not ill-appearing, toxic-appearing or diaphoretic.  HENT:     Head: Normocephalic and atraumatic.     Right Ear: Tympanic membrane, ear canal and external ear normal. Tympanic membrane  is not erythematous or bulging.     Left Ear: Tympanic membrane, ear canal and external ear normal. Tympanic membrane is not erythematous or bulging.     Nose: Rhinorrhea present.     Right Sinus: Maxillary sinus tenderness present. No frontal sinus tenderness.     Left Sinus: Maxillary sinus tenderness present. No frontal sinus tenderness.     Mouth/Throat:     Pharynx: Uvula midline.  Eyes:     General: Lids are normal. Lids are everted, no foreign bodies appreciated.     Extraocular Movements: Extraocular movements intact.     Conjunctiva/sclera:     Right eye: Right conjunctiva is injected.     Left eye: Left conjunctiva is not injected.     Pupils: Pupils are equal, round, and reactive to light.     Comments: No ciliary injection. No photophobia on exam.   Neck:     Musculoskeletal: Normal range of motion and neck supple.  Cardiovascular:     Rate and Rhythm: Normal rate and regular rhythm.     Heart sounds: Normal heart sounds. No murmur. No friction rub. No gallop.   Pulmonary:     Effort: Pulmonary effort is normal. No respiratory distress.     Breath sounds: Normal breath sounds. No stridor. No decreased breath sounds, wheezing, rhonchi or rales.  Lymphadenopathy:     Cervical: No cervical adenopathy.  Skin:    General: Skin is warm and dry.  Neurological:     Mental Status: He is alert and oriented to person, place, and time.  Psychiatric:        Behavior: Behavior normal.        Judgment: Judgment normal.      UC Treatments / Results  Labs (all labs ordered are listed, but only abnormal results are displayed) Labs Reviewed - No data to display  EKG None  Radiology No results found.  Procedures Procedures (including critical care time)  Medications Ordered in UC Medications - No data to display  Initial Impression / Assessment and Plan / UC Course  I have reviewed the triage vital signs and the nursing notes.  Pertinent labs & imaging results that  were available during my care of the patient were reviewed by me and considered in my medical decision making (see chart for details).    Start ofloxacin drops as directed. Lid scrubs and warm compresses as directed. Patient to follow up with ophthalmology if  symptoms worsens or does not improve. Return precautions given.   Chart review showed that patient did not receive abx 2 weeks ago. At that time with 5 day of URI symptoms and was provided tessalon for symptomatic relief. Given patient has been taking as directed without improvement of symptoms, will provide doxycycline as directed. Return precautions given. Otherwise, follow up with PCP for further evaluation needed.   Final Clinical Impressions(s) / UC Diagnoses   Final diagnoses:  Acute conjunctivitis of right eye, unspecified acute conjunctivitis type  Nasal congestion    ED Prescriptions    Medication Sig Dispense Auth. Provider   ofloxacin (OCUFLOX) 0.3 % ophthalmic solution Place 1 drop into the right eye 4 (four) times daily for 7 days. 1.4 mL Geraldene Eisel V, PA-C   ipratropium (ATROVENT) 0.06 % nasal spray Place 2 sprays into both nostrils 4 (four) times daily. 15 mL Dickson Kostelnik V, PA-C   doxycycline (VIBRAMYCIN) 100 MG capsule Take 1 capsule (100 mg total) by mouth 2 (two) times daily. 20 capsule Threasa Alpha, [place] 05/09/18 1304

## 2018-05-09 NOTE — Discharge Instructions (Signed)
Use ofloxacin eyedrops as directed on right eye. Lid scrubs and warm compresses as directed. Monitor for any worsening of symptoms, changes in vision, sensitivity to light, eye swelling, painful eye movement, follow up with ophthalmology for further evaluation.   Start doxycycline to cover for bacterial sinus infection. Start atrovent nasal spray for nasal congestion/drainage. You can use over the counter nasal saline rinse such as neti pot for nasal congestion. Keep hydrated, your urine should be clear to pale yellow in color. Tylenol/motrin for fever and pain. Monitor for any worsening of symptoms, chest pain, shortness of breath, wheezing, swelling of the throat, follow up for reevaluation.

## 2018-05-09 NOTE — ED Triage Notes (Signed)
Pt reports still being on antibiotics for a URI he was treated for two weeks ago.  Three days ago he developed redness, drainage, and pain in his right eye.

## 2018-06-13 ENCOUNTER — Emergency Department (HOSPITAL_COMMUNITY): Payer: Self-pay

## 2018-06-13 ENCOUNTER — Emergency Department (HOSPITAL_COMMUNITY)
Admission: EM | Admit: 2018-06-13 | Discharge: 2018-06-13 | Disposition: A | Discharge status: Home / Self Care | Payer: Self-pay | Attending: Emergency Medicine | Admitting: Emergency Medicine

## 2018-06-13 ENCOUNTER — Encounter (HOSPITAL_COMMUNITY): Payer: Self-pay

## 2018-06-13 ENCOUNTER — Other Ambulatory Visit: Payer: Self-pay

## 2018-06-13 DIAGNOSIS — R05 Cough: Secondary | ICD-10-CM | POA: Insufficient documentation

## 2018-06-13 DIAGNOSIS — R6889 Other general symptoms and signs: Secondary | ICD-10-CM

## 2018-06-13 DIAGNOSIS — Z87891 Personal history of nicotine dependence: Secondary | ICD-10-CM | POA: Insufficient documentation

## 2018-06-13 DIAGNOSIS — R030 Elevated blood-pressure reading, without diagnosis of hypertension: Secondary | ICD-10-CM

## 2018-06-13 DIAGNOSIS — Z79899 Other long term (current) drug therapy: Secondary | ICD-10-CM | POA: Insufficient documentation

## 2018-06-13 DIAGNOSIS — E119 Type 2 diabetes mellitus without complications: Secondary | ICD-10-CM | POA: Insufficient documentation

## 2018-06-13 DIAGNOSIS — Z7984 Long term (current) use of oral hypoglycemic drugs: Secondary | ICD-10-CM | POA: Insufficient documentation

## 2018-06-13 DIAGNOSIS — R059 Cough, unspecified: Secondary | ICD-10-CM

## 2018-06-13 MED ORDER — ONDANSETRON 4 MG PO TBDP: 4.0000 mg | ORAL_TABLET | Freq: Three times a day (TID) | ORAL | 0 refills | Status: DC | PRN

## 2018-06-13 MED ORDER — OSELTAMIVIR PHOSPHATE 75 MG PO CAPS: 75.0000 mg | ORAL_CAPSULE | Freq: Two times a day (BID) | ORAL | 0 refills | Status: DC

## 2018-06-13 NOTE — ED Triage Notes (Signed)
Pt states he has had cough, congestion, cough, and generalized body aches. Pt states 2 days. Pt states his son was dx with the flu. Pt states he has been taking OTC meds which may have increased his BP.

## 2018-06-13 NOTE — Discharge Instructions (Addendum)
Take tamiflu as directed which may help decrease the severity and duration of symptoms if your illness is due to the flu; however, if you don't have the flu and it's just another viral illness, it will get better with time but tamiflu will not have much effect on it. TAMIFLU DOES NOT CURE THE FLU! Take on a full stomach as it can cause some nausea. Use zofran as directed as needed for nausea, take 30 minutes before taking tamiflu in order to lessen the nausea from tamiflu. Continue to stay well-hydrated. Gargle warm salt water and spit it out and use chloraseptic spray as needed for sore throat. Continue to alternate between Tylenol and Ibuprofen for pain or fever. Use regular Mucinex/Robitussin/etc for cough suppression/expectoration of mucus. Use over the counter flonase and the netipot to help with nasal congestion. May consider over-the-counter Benadryl or other antihistamine like Claritin/Zyrtec/etc to decrease secretions and for help with your symptoms. Use over the counter Coricidin HBP to help with symptoms. Avoid medications that would make your blood pressure go up, such as medications containing sudafed or caffeine. Your blood pressure was elevated today. Eat a low salt/low sodium diet, keep track of your blood pressures at home to take to your next doctor's visit, follow up with your regular doctor to be reassessed for elevated blood pressure. Follow up with your primary care doctor in 5-7 days for recheck of ongoing symptoms. Return to emergency department for emergent changing or worsening of symptoms.

## 2018-06-13 NOTE — ED Provider Notes (Signed)
[facility]-EMERGENCY DEPT Provider Note   CSN: [id number] Arrival date & time: 06/13/18  1322     History   Chief Complaint Chief Complaint  Patient presents with  . Generalized Body Aches    HPI Guy Gonzalez is a 48 y.o. male with a PMHx of DM2 and prior elevated BP readings without diagnosis of HTN, who presents to the ED with complaints of flulike symptoms that began 2 days ago.  Patient states that his son tested positive for the flu recently.  Symptoms include moderate constant chest and nasal congestion, body aches, cough with dark green sputum production, chills, and rhinorrhea.  He has tried Mucinex, cough drops, and TheraFlu with some relief of his symptoms.  No known aggravating factors.  He is a non-smoker and did receive his flu shot this year.  He denies any fevers that he is aware of, sore throat, ear pain or drainage, chest pain, shortness breath, abdominal pain, nausea, vomiting, diarrhea, constipation, dysuria, hematuria, numbness, tingling, focal weakness, headache, vision changes, or any other complaints at this time.  The history is provided by the patient and medical records. No language interpreter was used.    Past Medical History:  Diagnosis Date  . Diabetes mellitus without complication [facility])     Patient Active Problem List   Diagnosis Date Noted  . HYPERGLYCEMIA 11/13/2008  . OBESITY 04/06/2008  . Allergic rhinitis, cause unspecified 04/06/2008  . ELEVATED BLOOD PRESSURE WITHOUT DIAGNOSIS OF HYPERTENSION 04/06/2008    History reviewed. No pertinent surgical history.      Home Medications    Prior to Admission medications   Medication Sig Start Date End Date Taking? Authorizing Provider  benzonatate (TESSALON) 100 MG capsule Take 1 capsule (100 mg total) by mouth every 8 (eight) hours. 04/26/18   Dahlia Byes A, NP  cetirizine (ZYRTEC ALLERGY) 10 MG tablet Take 1 tablet (10 mg total) by mouth daily. 07/02/16   Everlene Farrier,  PA-C  doxycycline (VIBRAMYCIN) 100 MG capsule Take 1 capsule (100 mg total) by mouth 2 (two) times daily. 05/09/18   Cathie Hoops, Amy V, PA-C  fluticasone (FLONASE) 50 MCG/ACT nasal spray Place 2 sprays into both nostrils daily. 07/02/16   Everlene Farrier, PA-C  hydrochlorothiazide (HYDRODIURIL) 12.5 MG tablet Take 1 tablet (12.5 mg total) by mouth daily. 10/22/17   Lurene Shadow, PA-C  ipratropium (ATROVENT) 0.06 % nasal spray Place 2 sprays into both nostrils 4 (four) times daily. 05/09/18   Cathie Hoops, Amy V, PA-C  metFORMIN (GLUCOPHAGE) 500 MG tablet Take 2 tablets (1,000 mg total) by mouth 2 (two) times daily with a meal. 04/26/18 06/25/18  Janace Aris, NP    Family History Family History  Problem Relation Age of Onset  . Diabetes Father   . Heart failure Father     Social History Social History   Tobacco Use  . Smoking status: Former Smoker    Types: Cigarettes    Last attempt to quit: 05/11/2000    Years since quitting: 18.1  . Smokeless tobacco: Never Used  Substance Use Topics  . Alcohol use: Yes    Comment: socially  . Drug use: No     Allergies   Cephalexin and Penicillins   Review of Systems Review of Systems  Constitutional: Positive for chills. Negative for fever.  HENT: Positive for congestion and rhinorrhea. Negative for ear discharge, ear pain and sore throat.   Eyes: Negative for visual disturbance.  Respiratory: Positive for cough. Negative for shortness  of breath.   Cardiovascular: Negative for chest pain.  Gastrointestinal: Negative for abdominal pain, constipation, diarrhea, nausea and vomiting.  Genitourinary: Negative for dysuria and hematuria.  Musculoskeletal: Positive for myalgias. Negative for arthralgias.  Skin: Negative for color change.  Allergic/Immunologic: Positive for immunocompromised state (DM2).  Neurological: Negative for weakness, numbness and headaches.  Psychiatric/Behavioral: Negative for confusion.   All other systems reviewed and are negative  for acute change except as noted in the HPI.    Physical Exam Updated Vital Signs BP (!) 188/112 (BP Location: Right Arm)   Pulse 94   Temp 98.7 F (37.1 C) (Oral)   Resp 16   Ht 6\' 4"  (1.93 m)   Wt (!) 154 kg   SpO2 99%   BMI 41.33 kg/m  Re-check VS 3:29 PM: BP (!) 122/95   Pulse 95   Temp 98.7 F (37.1 C) (Oral)   Resp 15   Ht 6\' 4"  (1.93 m)   Wt (!) 154 kg   SpO2 98%   BMI 41.33 kg/m    Physical Exam Vitals signs and nursing note reviewed.  Constitutional:      General: He is not in acute distress.    Appearance: Normal appearance. He is well-developed. He is not toxic-appearing.     Comments: Afebrile, nontoxic, NAD, HTN initially but improved on recheck  HENT:     Head: Normocephalic and atraumatic.     Nose: Congestion present.     Mouth/Throat:     Mouth: Mucous membranes are moist.     Pharynx: Oropharynx is clear. Uvula midline. No pharyngeal swelling, oropharyngeal exudate, posterior oropharyngeal erythema or uvula swelling.     Tonsils: No tonsillar exudate or tonsillar abscesses.     Comments: Nose mildly congested. Oropharynx clear and moist, without uvular swelling or deviation, no trismus or drooling, no tonsillar swelling or erythema, no exudates.   Eyes:     General:        Right eye: No discharge.        Left eye: No discharge.     Conjunctiva/sclera: Conjunctivae normal.  Neck:     Musculoskeletal: Normal range of motion and neck supple.  Cardiovascular:     Rate and Rhythm: Normal rate and regular rhythm.     Pulses: Normal pulses.     Heart sounds: Normal heart sounds, S1 normal and S2 normal. No murmur. No friction rub. No gallop.   Pulmonary:     Effort: Pulmonary effort is normal. No respiratory distress.     Breath sounds: Normal breath sounds. No decreased breath sounds, wheezing, rhonchi or rales.     Comments: CTAB in all lung fields, no w/r/r, no hypoxia or increased WOB, speaking in full sentences, SpO2 99% on RA  Abdominal:      General: Bowel sounds are normal. There is no distension.     Palpations: Abdomen is soft. Abdomen is not rigid.     Tenderness: There is no abdominal tenderness. There is no right CVA tenderness, left CVA tenderness, guarding or rebound. Negative signs include Murphy's sign and McBurney's sign.  Musculoskeletal: Normal range of motion.  Skin:    General: Skin is warm and dry.     Findings: No rash.  Neurological:     Mental Status: He is alert and oriented to person, place, and time.     Sensory: Sensation is intact. No sensory deficit.     Motor: Motor function is intact.  Psychiatric:  Mood and Affect: Mood and affect normal.        Behavior: Behavior normal.      ED Treatments / Results  Labs (all labs ordered are listed, but only abnormal results are displayed) Labs Reviewed - No data to display  EKG None  Radiology Dg Chest 2 View  Result Date: 06/13/2018 CLINICAL DATA:  Patient with cough and congestion. EXAM: CHEST - 2 VIEW COMPARISON:  Chest radiograph 07/02/2016 FINDINGS: Normal cardiac and mediastinal contours. No consolidative pulmonary opacities. No pleural effusion or pneumothorax. Regional skeleton is unremarkable. IMPRESSION: No acute cardiopulmonary process. Electronically Signed   By: Annia Beltrew  Davis M.D.   On: 06/13/2018 15:17    Procedures Procedures (including critical care time)  Medications Ordered in ED Medications - No data to display   Initial Impression / Assessment and Plan / ED Course  I have reviewed the triage vital signs and the nursing notes.  Pertinent labs & imaging results that were available during my care of the patient were reviewed by me and considered in my medical decision making (see chart for details).     48 y.o. male here with flulike symptoms that began 2 days ago, his son tested positive for the flu recently as well.  On exam, clear lung exam, throat clear, nose mildly congested, exam otherwise benign.  Blood pressure  elevated initially but improved on recheck, but patient asymptomatic, doubt need for further intervention of this, could be from OTC meds he's been taking, advised he needs to f/up with PCP for recheck of this.  Suspect flu, will treat empirically given that he has a positive contact at home.  Will check chest x-ray to evaluate for possible pneumonia.  Will reassess shortly.  3:34 PM CXR negative for PNA. Symptoms c/w flu, especially given positive flu contacts at home. Will treat empirically with tamiflu, will send home with zofran for the nausea related to tamiflu. Advised use of OTC remedies for symptomatic relief, but avoiding things that would make his BP go up. Advised DASH diet and keeping log of BPs at home to take to his next PCP visit. F/up with PCP in 1wk for recheck and ongoing management of BP as well. I explained the diagnosis and have given explicit precautions to return to the ER including for any other new or worsening symptoms. The patient understands and accepts the medical plan as it's been dictated and I have answered their questions. Discharge instructions concerning home care and prescriptions have been given. The patient is STABLE and is discharged to home in good condition.    Final Clinical Impressions(s) / ED Diagnoses   Final diagnoses:  Flu-like symptoms  Cough  Elevated blood pressure reading without diagnosis of hypertension    ED Discharge Orders         Ordered    oseltamivir (TAMIFLU) 75 MG capsule  Every 12 hours,   Status:  Discontinued     06/13/18 1522    ondansetron (ZOFRAN ODT) 4 MG disintegrating tablet  Every 8 hours PRN     06/13/18 1522    oseltamivir (TAMIFLU) 75 MG capsule  Every 12 hours     06/13/18 [address]26           Eliceo Gladu, WastaMercedes, PA-C 06/13/18 1534    Samuel JesterMcManus, Kathleen, DO 06/14/18 1508

## 2019-08-14 ENCOUNTER — Encounter (HOSPITAL_COMMUNITY): Payer: Self-pay

## 2019-08-14 ENCOUNTER — Other Ambulatory Visit: Payer: Self-pay

## 2019-08-14 ENCOUNTER — Ambulatory Visit (HOSPITAL_COMMUNITY)
Admission: EM | Admit: 2019-08-14 | Discharge: 2019-08-14 | Disposition: A | Discharge status: Home / Self Care | Payer: 59 | Attending: Family Medicine | Admitting: Family Medicine

## 2019-08-14 DIAGNOSIS — Z760 Encounter for issue of repeat prescription: Secondary | ICD-10-CM

## 2019-08-14 DIAGNOSIS — J069 Acute upper respiratory infection, unspecified: Secondary | ICD-10-CM

## 2019-08-14 MED ORDER — METFORMIN HCL 500 MG PO TABS: 1000.0000 mg | ORAL_TABLET | Freq: Two times a day (BID) | ORAL | 1 refills | Status: DC

## 2019-08-14 MED ORDER — FLUTICASONE PROPIONATE 50 MCG/ACT NA SUSP: 2.0000 | Freq: Every day | NASAL | 0 refills | Status: DC

## 2019-08-14 MED ORDER — CLARITIN-D 12 HOUR 5-120 MG PO TB12: 1.0000 | ORAL_TABLET | Freq: Two times a day (BID) | ORAL | 0 refills | Status: DC

## 2019-08-14 MED ORDER — PREDNISONE 20 MG PO TABS: 20.0000 mg | ORAL_TABLET | Freq: Two times a day (BID) | ORAL | 0 refills | Status: DC

## 2019-08-14 NOTE — ED Triage Notes (Addendum)
Pt presents with complaints of nasal congestion x 4 days. Reports taking robitussin and mucinex and denies relief with either. Patient states he is not concerned for COVID at this time.    Requesting refill for metformin

## 2019-08-14 NOTE — Discharge Instructions (Signed)
Drink plenty of fluids Take the prednisone 2 x a day for 5 d Take the claritin D 2 x a day until symptoms resolve Take the flonase 2 x a day for 5 - 7 days Call if not improving in a couple of days Metformin refilled

## 2019-08-14 NOTE — ED Provider Notes (Signed)
[facility]    CSN: [id number] Arrival date & time: 08/14/19  1650      History   Chief Complaint Chief Complaint  Patient presents with  . Nasal Congestion    HPI USTIN Gonzalez is a 49 y.o. male.   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 He states he is almost out of his Metformin.  This will be refilled for him. He has an upper respiratory infection.  Going on for 4 days.  Sinus congestion, nasal congestion, postnasal drip, coughing and chest congestion.  He is taking over-the-counter medications.  He states that this is not helping.  He has trouble breathing through his nose.  This is making it hard to sleep at night. No fever or chills No exposure to Covid With no change sense of taste or smell, he does have a decreased appetite No underlying lung disease, cigarette smoking, asthma, COPD No underlying hypertension.  Blood pressures been elevated on a couple of occasions but he is not on blood pressure medication   Past Medical History:  Diagnosis Date  . Diabetes mellitus without complication [facility])     Patient Active Problem List   Diagnosis Date Noted  . HYPERGLYCEMIA 11/13/2008  . OBESITY 04/06/2008  . Allergic rhinitis, cause unspecified 04/06/2008  . ELEVATED BLOOD PRESSURE WITHOUT DIAGNOSIS OF HYPERTENSION 04/06/2008    History reviewed. No pertinent surgical history.     Home Medications    Prior to Admission medications   Medication Sig Start Date End Date Taking? Authorizing Provider  fluticasone (FLONASE) 50 MCG/ACT nasal spray Place 2 sprays into both nostrils daily. 08/14/19   Eustace Moore, MD  loratadine-pseudoephedrine (CLARITIN-D 12 HOUR) 5-120 MG tablet Take 1 tablet by mouth 2 (two) times daily. 08/14/19   Eustace Moore, MD  metFORMIN (GLUCOPHAGE) 500 MG tablet Take 2 tablets (1,000 mg total) by mouth 2 (two) times daily with a meal. 08/14/19 10/13/19  Eustace Moore, MD  predniSONE  (DELTASONE) 20 MG tablet Take 1 tablet (20 mg total) by mouth 2 (two) times daily with a meal. 08/14/19   Eustace Moore, MD  cetirizine (ZYRTEC ALLERGY) 10 MG tablet Take 1 tablet (10 mg total) by mouth daily. 07/02/16 08/14/19  Everlene Farrier, PA-C  hydrochlorothiazide (HYDRODIURIL) 12.5 MG tablet Take 1 tablet (12.5 mg total) by mouth daily. 10/22/17 08/14/19  Lurene Shadow, PA-C  ipratropium (ATROVENT) 0.06 % nasal spray Place 2 sprays into both nostrils 4 (four) times daily. 05/09/18 08/14/19  Belinda Fisher, PA-C    Family History Family History  Problem Relation Age of Onset  . Healthy Mother   . Diabetes Father   . Heart failure Father     Social History Social History   Tobacco Use  . Smoking status: Former Smoker    Types: Cigarettes    Quit date: 05/11/2000    Years since quitting: 19.2  . Smokeless tobacco: Never Used  Substance Use Topics  . Alcohol use: Yes    Comment: socially  . Drug use: No     Allergies   Cephalexin and Penicillins   Review of Systems Review of Systems  Constitutional: Positive for appetite change. Negative for chills, fatigue and fever.  HENT: Positive for congestion, postnasal drip, rhinorrhea and voice change. Negative for ear pain.   Eyes: Negative for redness.  Respiratory: Positive for cough.   Gastrointestinal: Negative for nausea and vomiting.     Physical Exam Triage  Vital Signs ED Triage Vitals  Enc Vitals Group     BP 08/14/19 1721 (!) 131/92     Pulse Rate 08/14/19 1721 92     Resp 08/14/19 1721 18     Temp 08/14/19 1721 98.4 F (36.9 C)     Temp src --      SpO2 08/14/19 1721 95 %     Weight --      Height --      Head Circumference --      Peak Flow --      Pain Score 08/14/19 1720 0     Pain Loc --      Pain Edu? --      Excl. in [place]? --    No data found.  Updated Vital Signs BP (!) 131/92   Pulse 92   Temp 98.4 F (36.9 C)   Resp 18   SpO2 95%     Physical Exam Constitutional:      General: He is not  in acute distress.    Appearance: He is well-developed.     Comments: Overweight  HENT:     Head: Normocephalic and atraumatic.     Right Ear: Tympanic membrane, ear canal and external ear normal.     Left Ear: Tympanic membrane, ear canal and external ear normal.     Nose: Congestion present.     Comments: Clear rhinorrhea.  No sinus tenderness.  Hoarse voice    Mouth/Throat:     Mouth: Mucous membranes are moist.     Pharynx: No posterior oropharyngeal erythema.  Eyes:     Conjunctiva/sclera: Conjunctivae normal.     Pupils: Pupils are equal, round, and reactive to light.  Cardiovascular:     Rate and Rhythm: Normal rate and regular rhythm.     Heart sounds: Normal heart sounds.  Pulmonary:     Effort: Pulmonary effort is normal. No respiratory distress.     Breath sounds: Normal breath sounds. No wheezing or rhonchi.  Musculoskeletal:        General: Normal range of motion.     Cervical back: Normal range of motion.  Lymphadenopathy:     Cervical: Cervical adenopathy present.  Skin:    General: Skin is warm and dry.  Neurological:     Mental Status: He is alert.  Psychiatric:        Mood and Affect: Mood normal.        Behavior: Behavior normal.      UC Treatments / Results  Labs (all labs ordered are listed, but only abnormal results are displayed) Labs Reviewed - No data to display  EKG   Radiology No results found.  Procedures Procedures (including critical care time)  Medications Ordered in UC Medications - No data to display  Initial Impression / Assessment and Plan / UC Course  I have reviewed the triage vital signs and the nursing notes.  Pertinent labs & imaging results that were available during my care of the patient were reviewed by me and considered in my medical decision making (see chart for details).     Viral upper respiratory infection.  No indication for antibiotics.  Discussed with patient Final Clinical Impressions(s) / UC Diagnoses    Final diagnoses:  Acute upper respiratory infection  Medication refill     Discharge Instructions     Drink plenty of fluids Take the prednisone 2 x a day for 5 d Take the claritin D 2 x a day until  symptoms resolve Take the flonase 2 x a day for 5 - 7 days Call if not improving in a couple of days Metformin refilled   ED Prescriptions    Medication Sig Dispense Auth. Provider   metFORMIN (GLUCOPHAGE) 500 MG tablet Take 2 tablets (1,000 mg total) by mouth 2 (two) times daily with a meal. 120 tablet Eustace Moore, MD   predniSONE (DELTASONE) 20 MG tablet Take 1 tablet (20 mg total) by mouth 2 (two) times daily with a meal. 10 tablet Eustace Moore, MD   fluticasone [facility]) 50 MCG/ACT nasal spray Place 2 sprays into both nostrils daily. 16 g Eustace Moore, MD   loratadine-pseudoephedrine (CLARITIN-D 12 HOUR) 5-120 MG tablet Take 1 tablet by mouth 2 (two) times daily. 14 tablet Eustace Moore, MD     PDMP not reviewed this encounter.   Eustace Moore, MD 08/14/19 Windy Fast

## 2019-08-18 ENCOUNTER — Emergency Department (HOSPITAL_COMMUNITY)
Admission: EM | Admit: 2019-08-18 | Discharge: 2019-08-18 | Disposition: A | Discharge status: Home / Self Care | Payer: 59 | Attending: Emergency Medicine | Admitting: Emergency Medicine

## 2019-08-18 ENCOUNTER — Encounter (HOSPITAL_COMMUNITY): Payer: Self-pay

## 2019-08-18 DIAGNOSIS — K0889 Other specified disorders of teeth and supporting structures: Secondary | ICD-10-CM | POA: Diagnosis present

## 2019-08-18 DIAGNOSIS — I10 Essential (primary) hypertension: Secondary | ICD-10-CM | POA: Insufficient documentation

## 2019-08-18 DIAGNOSIS — E119 Type 2 diabetes mellitus without complications: Secondary | ICD-10-CM | POA: Insufficient documentation

## 2019-08-18 DIAGNOSIS — K047 Periapical abscess without sinus: Secondary | ICD-10-CM | POA: Diagnosis not present

## 2019-08-18 DIAGNOSIS — Z87891 Personal history of nicotine dependence: Secondary | ICD-10-CM | POA: Insufficient documentation

## 2019-08-18 DIAGNOSIS — Z7984 Long term (current) use of oral hypoglycemic drugs: Secondary | ICD-10-CM | POA: Diagnosis not present

## 2019-08-18 DIAGNOSIS — Z79899 Other long term (current) drug therapy: Secondary | ICD-10-CM | POA: Insufficient documentation

## 2019-08-18 MED ORDER — CLINDAMYCIN HCL 150 MG PO CAPS: 300.0000 mg | ORAL_CAPSULE | Freq: Three times a day (TID) | ORAL | 0 refills | Status: DC

## 2019-08-18 MED ORDER — CLINDAMYCIN HCL 300 MG PO CAPS
300.0000 mg | ORAL_CAPSULE | Freq: Once | ORAL | Status: AC
  Administered 2019-08-18: 300 mg via ORAL
  Filled 2019-08-18: qty 1

## 2019-08-18 MED ORDER — NAPROXEN 500 MG PO TABS: 500.0000 mg | ORAL_TABLET | Freq: Two times a day (BID) | ORAL | 0 refills | Status: DC

## 2019-08-18 MED ORDER — NAPROXEN 500 MG PO TABS
500.0000 mg | ORAL_TABLET | Freq: Once | ORAL | Status: AC
  Administered 2019-08-18: 500 mg via ORAL
  Filled 2019-08-18: qty 1

## 2019-08-18 NOTE — ED Triage Notes (Signed)
Pt complains of a dental abscess on the left lower side for two days

## 2019-08-18 NOTE — Discharge Instructions (Signed)
Take the prescribed medication as directed. Follow-up with your dentist as scheduled. Return to the ED for new or worsening symptoms.

## 2019-08-18 NOTE — ED Provider Notes (Signed)
[facility] Provider Note   CSN: [id number] Arrival date & time: 08/18/19  0041     History No chief complaint on file.   Guy Gonzalez is a 49 y.o. male.  The history is provided by the patient and medical records.    49 y.o. M wit hx of DM, presenting to the ED for left dental infection.  Patient reports this began 2 days ago and seems to be worsening.  He did arrange follow-up with new dentist but appointment is not until 08/23/2019.  He has not had any fever but has had increased difficulty eating.  No trouble swallowing.  No neck pain or stiffness.  He has been taking some over-the-counter medications without relief.  Past Medical History:  Diagnosis Date  . Diabetes mellitus without complication [facility])     Patient Active Problem List   Diagnosis Date Noted  . HYPERGLYCEMIA 11/13/2008  . OBESITY 04/06/2008  . Allergic rhinitis, cause unspecified 04/06/2008  . ELEVATED BLOOD PRESSURE WITHOUT DIAGNOSIS OF HYPERTENSION 04/06/2008    History reviewed. No pertinent surgical history.     Family History  Problem Relation Age of Onset  . Healthy Mother   . Diabetes Father   . Heart failure Father     Social History   Tobacco Use  . Smoking status: Former Smoker    Types: Cigarettes    Quit date: 05/11/2000    Years since quitting: 19.2  . Smokeless tobacco: Never Used  Substance Use Topics  . Alcohol use: Yes    Comment: socially  . Drug use: No    Home Medications Prior to Admission medications   Medication Sig Start Date End Date Taking? Authorizing Provider  fluticasone (FLONASE) 50 MCG/ACT nasal spray Place 2 sprays into both nostrils daily. 08/14/19   Eustace Moore, MD  loratadine-pseudoephedrine (CLARITIN-D 12 HOUR) 5-120 MG tablet Take 1 tablet by mouth 2 (two) times daily. 08/14/19   Eustace Moore, MD  metFORMIN (GLUCOPHAGE) 500 MG tablet Take 2 tablets (1,000 mg total) by mouth 2 (two) times daily with a meal.  08/14/19 10/13/19  Eustace Moore, MD  predniSONE (DELTASONE) 20 MG tablet Take 1 tablet (20 mg total) by mouth 2 (two) times daily with a meal. 08/14/19   Eustace Moore, MD  cetirizine (ZYRTEC ALLERGY) 10 MG tablet Take 1 tablet (10 mg total) by mouth daily. 07/02/16 08/14/19  Everlene Farrier, PA-C  hydrochlorothiazide (HYDRODIURIL) 12.5 MG tablet Take 1 tablet (12.5 mg total) by mouth daily. 10/22/17 08/14/19  Lurene Shadow, PA-C  ipratropium (ATROVENT) 0.06 % nasal spray Place 2 sprays into both nostrils 4 (four) times daily. 05/09/18 08/14/19  Belinda Fisher, PA-C    Allergies    Cephalexin and Penicillins  Review of Systems   Review of Systems  HENT: Positive for dental problem.   All other systems reviewed and are negative.   Physical Exam Updated Vital Signs BP (!) 159/87 (BP Location: Right Arm)   Pulse 100   Temp 98.3 F (36.8 C) (Oral)   Resp 18   Ht 6\' 4"  (1.93 m)   Wt 132.5 kg   SpO2 99%   BMI 35.54 kg/m   Physical Exam Vitals and nursing note reviewed.  Constitutional:      General: He is not in acute distress.    Appearance: He is well-developed.  HENT:     Head: Normocephalic and atraumatic.     Mouth/Throat:     Mouth: Mucous  membranes are moist.     Pharynx: Oropharynx is clear.     Comments: Teeth largely in good dentition, abscess of left lower gingiva that appears to be arising from around the left lower canine, handling secretions appropriately, no trismus, no facial or neck swelling, normal phonation without stridor Eyes:     Conjunctiva/sclera: Conjunctivae normal.     Pupils: Pupils are equal, round, and reactive to light.  Cardiovascular:     Rate and Rhythm: Normal rate and regular rhythm.     Heart sounds: Normal heart sounds.  Pulmonary:     Effort: Pulmonary effort is normal.     Breath sounds: Normal breath sounds.  Abdominal:     General: Bowel sounds are normal.     Palpations: Abdomen is soft.  Musculoskeletal:        General: Normal  range of motion.     Cervical back: Normal range of motion.  Skin:    General: Skin is warm and dry.  Neurological:     Mental Status: He is alert and oriented to person, place, and time.     ED Results / Procedures / Treatments   Labs (all labs ordered are listed, but only abnormal results are displayed) Labs Reviewed - No data to display  EKG None  Radiology No results found.  Procedures Procedures (including critical care time)  Medications Ordered in ED Medications  clindamycin (CLEOCIN) capsule 300 mg (300 mg Oral Given 08/18/19 0136)  naproxen (NAPROSYN) tablet 500 mg (500 mg Oral Given 08/18/19 0136)    ED Course  I have reviewed the triage vital signs and the nursing notes.  Pertinent labs & imaging results that were available during my care of the patient were reviewed by me and considered in my medical decision making (see chart for details).    MDM Rules/Calculators/A&P  49 year old male here with left lower dental abscess.  This appears to be arising from the left lower canine.  Tooth itself is normal in appearance without visible decay.  Does have some localized facial swelling but no extension into the cheek or neck.  He is handling his secretions well, normal phonation without stridor.  He is afebrile and nontoxic.  Clinically not concerning for Ludwig's angina.  He has penicillin allergy so will start on course of clindamycin for dental abscess.  He should follow-up with his dentist as scheduled on 08/23/2019.  He will return here for any new or acute changes.  Final Clinical Impression(s) / ED Diagnoses Final diagnoses:  Dental abscess    Rx / DC Orders ED Discharge Orders         Ordered    clindamycin (CLEOCIN) 150 MG capsule  3 times daily     08/18/19 0132    naproxen (NAPROSYN) 500 MG tablet  2 times daily with meals     08/18/19 0132           Larene Pickett, PA-C 08/18/19 0139    Ward, Delice Bison, DO 08/18/19 6222

## 2019-09-22 ENCOUNTER — Encounter (HOSPITAL_COMMUNITY): Payer: Self-pay

## 2019-09-22 ENCOUNTER — Other Ambulatory Visit: Payer: Self-pay

## 2019-09-22 ENCOUNTER — Ambulatory Visit (HOSPITAL_COMMUNITY)
Admission: EM | Admit: 2019-09-22 | Discharge: 2019-09-22 | Disposition: A | Discharge status: Home / Self Care | Payer: 59 | Attending: Internal Medicine | Admitting: Internal Medicine

## 2019-09-22 DIAGNOSIS — K047 Periapical abscess without sinus: Secondary | ICD-10-CM

## 2019-09-22 MED ORDER — IBUPROFEN 800 MG PO TABS: 800.0000 mg | ORAL_TABLET | Freq: Three times a day (TID) | ORAL | 0 refills | Status: DC

## 2019-09-22 MED ORDER — CLINDAMYCIN HCL 300 MG PO CAPS: 300.0000 mg | ORAL_CAPSULE | Freq: Three times a day (TID) | ORAL | 0 refills | Status: AC

## 2019-09-22 NOTE — ED Provider Notes (Signed)
[facility]    CSN: [id number] Arrival date & time: 09/22/19  1549      History   Chief Complaint Chief Complaint  Patient presents with  . Dental Pain    HPI Guy Gonzalez is a 49 y.o. male comes to urgent care with complaints of left jaw pain of 2 days duration.  Patient says pain started 2 days ago and has gotten progressively worse.  Pain is severe, currently 10 out of 10, aggravated by palpation, no known relieving factors, nonradiating and is associated with swelling on the left jaw.  No fever or chills.  Patient has poor dentition and is scheduled to see his dentist next week Tuesday.   HPI  Past Medical History:  Diagnosis Date  . Diabetes mellitus without complication [facility])     Patient Active Problem List   Diagnosis Date Noted  . HYPERGLYCEMIA 11/13/2008  . OBESITY 04/06/2008  . Allergic rhinitis, cause unspecified 04/06/2008  . ELEVATED BLOOD PRESSURE WITHOUT DIAGNOSIS OF HYPERTENSION 04/06/2008    History reviewed. No pertinent surgical history.     Home Medications    Prior to Admission medications   Medication Sig Start Date End Date Taking? Authorizing Provider  metFORMIN (GLUCOPHAGE) 500 MG tablet Take by mouth. 04/26/18  Yes [provider]  clindamycin (CLEOCIN) 300 MG capsule Take 1 capsule (300 mg total) by mouth 3 (three) times daily for 7 days. May dispense as 150mg  capsules 09/22/19 09/29/19  Jeson Camacho, 10/01/19, MD  fluticasone (FLONASE) 50 MCG/ACT nasal spray Place 2 sprays into both nostrils daily. 08/14/19   10/14/19, MD  ibuprofen (ADVIL) 800 MG tablet Take 1 tablet (800 mg total) by mouth 3 (three) times daily. 09/22/19   Azel Gumina, 09/24/19, MD  loratadine-pseudoephedrine (CLARITIN-D 12 HOUR) 5-120 MG tablet Take 1 tablet by mouth 2 (two) times daily. 08/14/19   10/14/19, MD  metFORMIN (GLUCOPHAGE) 500 MG tablet Take 2 tablets (1,000 mg total) by mouth 2 (two) times daily with a meal. 08/14/19 10/13/19   12/13/19, MD  naproxen (NAPROSYN) 500 MG tablet Take 1 tablet (500 mg total) by mouth 2 (two) times daily with a meal. 08/18/19   08/20/19, PA-C  predniSONE (DELTASONE) 20 MG tablet Take 1 tablet (20 mg total) by mouth 2 (two) times daily with a meal. 08/14/19   10/14/19, MD  cetirizine (ZYRTEC ALLERGY) 10 MG tablet Take 1 tablet (10 mg total) by mouth daily. 07/02/16 08/14/19  10/14/19, PA-C  hydrochlorothiazide (HYDRODIURIL) 12.5 MG tablet Take 1 tablet (12.5 mg total) by mouth daily. 10/22/17 08/14/19  10/14/19, PA-C  ipratropium (ATROVENT) 0.06 % nasal spray Place 2 sprays into both nostrils 4 (four) times daily. 05/09/18 08/14/19  10/14/19, PA-C    Family History Family History  Problem Relation Age of Onset  . Healthy Mother   . Diabetes Father   . Heart failure Father     Social History Social History   Tobacco Use  . Smoking status: Former Smoker    Types: Cigarettes    Quit date: 05/11/2000    Years since quitting: 19.3  . Smokeless tobacco: Never Used  Substance Use Topics  . Alcohol use: Yes    Comment: socially  . Drug use: No     Allergies   Penicillins and Cephalexin   Review of Systems Review of Systems  HENT: Positive for dental problem. Negative for sinus pressure, sneezing and sore throat.  Respiratory: Negative.   Gastrointestinal: Negative.   Genitourinary: Negative.      Physical Exam Triage Vital Signs ED Triage Vitals  Enc Vitals Group     BP 09/22/19 1646 129/88     Pulse Rate 09/22/19 1646 87     Resp 09/22/19 1646 20     Temp 09/22/19 1646 98 F (36.7 C)     Temp Source 09/22/19 1646 Oral     SpO2 09/22/19 1646 94 %     Weight 09/22/19 1644 291 lb (132 kg)     Height --      Head Circumference --      Peak Flow --      Pain Score 09/22/19 1643 10     Pain Loc --      Pain Edu? --      Excl. in [place]? --    No data found.  Updated Vital Signs BP 129/88 (BP Location: Right Arm)   Pulse 87   Temp  98 F (36.7 C) (Oral)   Resp 20   Wt 132 kg   SpO2 94%   BMI 35.42 kg/m   Visual Acuity Right Eye Distance:   Left Eye Distance:   Bilateral Distance:    Right Eye Near:   Left Eye Near:    Bilateral Near:     Physical Exam Vitals and nursing note reviewed.  Constitutional:      General: He is in acute distress.  HENT:     Right Ear: Tympanic membrane normal.     Left Ear: Tympanic membrane normal.     Mouth/Throat:     Comments: Left jaw swelling.  Tender to palpation.  Poor dentition with dental cavity in the left first mandibular premolar. Neck:     Vascular: No carotid bruit.  Cardiovascular:     Rate and Rhythm: Normal rate and regular rhythm.  Musculoskeletal:     Cervical back: Normal range of motion. No rigidity or tenderness.  Lymphadenopathy:     Cervical: No cervical adenopathy.  Neurological:     Mental Status: He is alert.      UC Treatments / Results  Labs (all labs ordered are listed, but only abnormal results are displayed) Labs Reviewed - No data to display  EKG   Radiology No results found.  Procedures Procedures (including critical care time)  Medications Ordered in UC Medications - No data to display  Initial Impression / Assessment and Plan / UC Course  I have reviewed the triage vital signs and the nursing notes.  Pertinent labs & imaging results that were available during my care of the patient were reviewed by me and considered in my medical decision making (see chart for details).     1.  Dental infection: Clindamycin 300 mg 3 times daily x7 days Ibuprofen 800 mg every 8 hours as needed for pain Patient to follow-up with the dentist for dental work/tooth extraction Return precautions given If patient symptoms worsens he is advised to return to urgent care to be reevaluated. Final Clinical Impressions(s) / UC Diagnoses   Final diagnoses:  Dental infection   Discharge Instructions   None    ED Prescriptions     Medication Sig Dispense Auth. Provider   clindamycin (CLEOCIN) 300 MG capsule Take 1 capsule (300 mg total) by mouth 3 (three) times daily for 7 days. May dispense as 150mg  capsules 21 capsule Tahmir Kleckner, Myrene Galas, MD   ibuprofen (ADVIL) 800 MG tablet Take 1 tablet (800 mg  total) by mouth 3 (three) times daily. 21 tablet Zedric Deroy, Britta Mccreedy, MD     PDMP not reviewed this encounter.   Merrilee Jansky, MD 09/22/19 2013

## 2019-09-22 NOTE — ED Triage Notes (Signed)
Pt is here with a dental abscess that started 2 days ago and has now gotten bigger. Pt has taken Motrin, salt water to relieve discomfort.

## 2019-12-16 ENCOUNTER — Ambulatory Visit (INDEPENDENT_AMBULATORY_CARE_PROVIDER_SITE_OTHER): Payer: 59

## 2019-12-16 ENCOUNTER — Encounter (INDEPENDENT_AMBULATORY_CARE_PROVIDER_SITE_OTHER): Payer: Self-pay

## 2019-12-16 ENCOUNTER — Encounter (HOSPITAL_COMMUNITY): Payer: Self-pay | Admitting: Emergency Medicine

## 2019-12-16 ENCOUNTER — Other Ambulatory Visit: Payer: Self-pay

## 2019-12-16 ENCOUNTER — Ambulatory Visit (HOSPITAL_COMMUNITY)
Admission: EM | Admit: 2019-12-16 | Discharge: 2019-12-16 | Disposition: A | Discharge status: Home / Self Care | Payer: 59 | Attending: Physician Assistant | Admitting: Physician Assistant

## 2019-12-16 DIAGNOSIS — Z20822 Contact with and (suspected) exposure to covid-19: Secondary | ICD-10-CM | POA: Diagnosis not present

## 2019-12-16 DIAGNOSIS — Z791 Long term (current) use of non-steroidal anti-inflammatories (NSAID): Secondary | ICD-10-CM | POA: Diagnosis not present

## 2019-12-16 DIAGNOSIS — R0989 Other specified symptoms and signs involving the circulatory and respiratory systems: Secondary | ICD-10-CM | POA: Diagnosis not present

## 2019-12-16 DIAGNOSIS — R059 Cough, unspecified: Secondary | ICD-10-CM

## 2019-12-16 DIAGNOSIS — E119 Type 2 diabetes mellitus without complications: Secondary | ICD-10-CM | POA: Diagnosis not present

## 2019-12-16 DIAGNOSIS — R509 Fever, unspecified: Secondary | ICD-10-CM

## 2019-12-16 DIAGNOSIS — R0789 Other chest pain: Secondary | ICD-10-CM | POA: Diagnosis present

## 2019-12-16 DIAGNOSIS — Z87891 Personal history of nicotine dependence: Secondary | ICD-10-CM | POA: Insufficient documentation

## 2019-12-16 DIAGNOSIS — Z833 Family history of diabetes mellitus: Secondary | ICD-10-CM | POA: Diagnosis not present

## 2019-12-16 DIAGNOSIS — R490 Dysphonia: Secondary | ICD-10-CM | POA: Insufficient documentation

## 2019-12-16 DIAGNOSIS — Z7984 Long term (current) use of oral hypoglycemic drugs: Secondary | ICD-10-CM | POA: Diagnosis not present

## 2019-12-16 DIAGNOSIS — R05 Cough: Secondary | ICD-10-CM | POA: Insufficient documentation

## 2019-12-16 DIAGNOSIS — R21 Rash and other nonspecific skin eruption: Secondary | ICD-10-CM | POA: Diagnosis not present

## 2019-12-16 DIAGNOSIS — Z8249 Family history of ischemic heart disease and other diseases of the circulatory system: Secondary | ICD-10-CM | POA: Diagnosis not present

## 2019-12-16 LAB — SARS CORONAVIRUS 2 (TAT 6-24 HRS): SARS Coronavirus 2: NEGATIVE

## 2019-12-16 LAB — CBG MONITORING, ED: Glucose-Capillary: 382 mg/dL — ABNORMAL HIGH (ref 70–99)

## 2019-12-16 MED ORDER — BENZONATATE 100 MG PO CAPS: 100.0000 mg | ORAL_CAPSULE | Freq: Three times a day (TID) | ORAL | 0 refills | Status: DC | PRN

## 2019-12-16 MED ORDER — METFORMIN HCL 1000 MG PO TABS: 1000.0000 mg | ORAL_TABLET | Freq: Two times a day (BID) | ORAL | 0 refills | Status: DC

## 2019-12-16 MED ORDER — CETIRIZINE HCL 10 MG PO TABS: 10.0000 mg | ORAL_TABLET | Freq: Every day | ORAL | 0 refills | Status: DC

## 2019-12-16 MED ORDER — PROMETHAZINE-DM 6.25-15 MG/5ML PO SYRP: 5.0000 mL | ORAL_SOLUTION | Freq: Every evening | ORAL | 0 refills | Status: DC | PRN

## 2019-12-16 NOTE — ED Provider Notes (Signed)
[facility]   MRN: [id number] DOB: 1970-08-18  Subjective:   Guy Gonzalez is a 49 y.o. male presenting for 2-week history of intermittently productive cough, chest congestion/tightness, mild hoarseness of his throat, nausea without vomiting.  Denies fever, headache, confusion, ear pain, chest pain, shortness of breath, vomiting, diarrhea, constipation, loss of sense of taste and smell.  Patient has not had COVID vaccination.  He is a diabetic, has not had his blood sugar checked in months.  States that his PCP left the practice and has not been back to establish care with a new person.  Would like recommendations for this.  No current facility-administered medications for this encounter.  Current Outpatient Medications:  .  fluticasone (FLONASE) 50 MCG/ACT nasal spray, Place 2 sprays into both nostrils daily., Disp: 16 g, Rfl: 0 .  ibuprofen (ADVIL) 800 MG tablet, Take 1 tablet (800 mg total) by mouth 3 (three) times daily., Disp: 21 tablet, Rfl: 0 .  loratadine-pseudoephedrine (CLARITIN-D 12 HOUR) 5-120 MG tablet, Take 1 tablet by mouth 2 (two) times daily., Disp: 14 tablet, Rfl: 0 .  metFORMIN (GLUCOPHAGE) 500 MG tablet, Take 2 tablets (1,000 mg total) by mouth 2 (two) times daily with a meal., Disp: 120 tablet, Rfl: 1 .  metFORMIN (GLUCOPHAGE) 500 MG tablet, Take by mouth., Disp: , Rfl:  .  naproxen (NAPROSYN) 500 MG tablet, Take 1 tablet (500 mg total) by mouth 2 (two) times daily with a meal., Disp: 30 tablet, Rfl: 0 .  predniSONE (DELTASONE) 20 MG tablet, Take 1 tablet (20 mg total) by mouth 2 (two) times daily with a meal., Disp: 10 tablet, Rfl: 0   Allergies  Allergen Reactions  . Penicillins Swelling  . Cephalexin Rash    Past Medical History:  Diagnosis Date  . Diabetes mellitus without complication (HCC)      History reviewed. No pertinent surgical history.  Family History  Problem Relation Age of Onset  . Healthy Mother   . Diabetes Father   . Heart  failure Father     Social History   Tobacco Use  . Smoking status: Former Smoker    Types: Cigarettes    Quit date: 05/11/2000    Years since quitting: 19.6  . Smokeless tobacco: Never Used  Substance Use Topics  . Alcohol use: Yes    Comment: socially  . Drug use: No    ROS   Objective:   Vitals: BP 126/87 (BP Location: Right Arm)   Pulse 98   Temp 98.3 F (36.8 C) (Oral)   Resp 18   SpO2 97%   Physical Exam Constitutional:      General: He is not in acute distress.    Appearance: Normal appearance. He is well-developed and normal weight. He is not ill-appearing, toxic-appearing or diaphoretic.  HENT:     Head: Normocephalic and atraumatic.     Right Ear: Tympanic membrane, ear canal and external ear normal. There is no impacted cerumen.     Left Ear: Tympanic membrane, ear canal and external ear normal. There is no impacted cerumen.     Nose: Nose normal. No congestion or rhinorrhea.     Mouth/Throat:     Mouth: Mucous membranes are moist.     Pharynx: Oropharynx is clear. No oropharyngeal exudate or posterior oropharyngeal erythema.  Eyes:     General: No scleral icterus.       Right eye: No discharge.        Left eye: No discharge.  Extraocular Movements: Extraocular movements intact.     Conjunctiva/sclera: Conjunctivae normal.     Pupils: Pupils are equal, round, and reactive to light.  Cardiovascular:     Rate and Rhythm: Normal rate and regular rhythm.     Heart sounds: Normal heart sounds. No murmur heard.  No friction rub. No gallop.   Pulmonary:     Effort: Pulmonary effort is normal. No respiratory distress.     Breath sounds: No stridor. No wheezing, rhonchi or rales.     Comments: Decreased lung sounds. Musculoskeletal:     Cervical back: Normal range of motion and neck supple. No rigidity. No muscular tenderness.  Neurological:     General: No focal deficit present.     Mental Status: He is alert and oriented to person, place, and time.   Psychiatric:        Mood and Affect: Mood normal.        Behavior: Behavior normal.        Thought Content: Thought content normal.        Judgment: Judgment normal.     Results for orders placed or performed during the hospital encounter of 12/16/19 (from the past 24 hour(s))  POC CBG monitoring     Status: Abnormal   Collection Time: 12/16/19  3:15 PM  Result Value Ref Range   Glucose-Capillary 382 (H) 70 - 99 mg/dL     DG Chest 2 View  Result Date: 12/16/2019 CLINICAL DATA:  Chest congestion for two weeks, no fever, rash over right eye, no N/V/D. EXAM: CHEST - 2 VIEW COMPARISON:  Chest radiograph 06/13/2018 FINDINGS: The heart size and mediastinal contours are within normal limits. The lungs are clear. No pneumothorax or pleural effusion. The visualized skeletal structures are unremarkable. IMPRESSION: No acute cardiopulmonary process. Electronically Signed   By: Emmaline Kluver M.D.   On: 12/16/2019 15:37    Assessment and Plan :   PDMP not reviewed this encounter.  1. Cough   2. Chest tightness   3. Hoarseness   4. Type 2 diabetes mellitus without complication, without long-term current use of insulin (HCC)     Refilled his Metformin at 1000 mg twice daily with food.  Recommend he establish care with new PCP, information provided to multiple practices.  COVID-19 testing pending.  Recommend supportive care. Counseled patient on potential for adverse effects with medications prescribed/recommended today, ER and return-to-clinic precautions discussed, patient verbalized understanding.    Wallis Bamberg, PA-C 12/16/19 1540

## 2019-12-16 NOTE — ED Triage Notes (Signed)
Pt presents with chest congestion, hoarseness, nausea, cough with minimal green production xs 2 weeks.  Denies SOB, chest pain, fever, sore throat, headache, V,D, loss of taste or smell.   OTC medication give no relief.

## 2019-12-16 NOTE — Discharge Instructions (Signed)
Please make sure you establish care with a new primary care provider from the left side of provided.  I have refilled your Metformin at 1000 mg twice daily.  Your COVID-19 testing is pending and we will notify you of your results through MyChart.  Please take medications for supportive care as I prescribed to him.  If you develop worsening symptoms including high fevers, chest pains, difficulty with your breathing then please come back to our clinic or if your symptoms are severe then report to the emergency room.

## 2019-12-31 ENCOUNTER — Emergency Department (HOSPITAL_COMMUNITY): Payer: 59

## 2019-12-31 ENCOUNTER — Other Ambulatory Visit: Payer: Self-pay

## 2019-12-31 ENCOUNTER — Inpatient Hospital Stay (HOSPITAL_COMMUNITY)
Admission: EM | Admit: 2019-12-31 | Discharge: 2020-01-11 | DRG: 637 | Disposition: A | Discharge status: Home Health | Payer: 59 | Attending: Family Medicine | Admitting: Family Medicine

## 2019-12-31 ENCOUNTER — Encounter (HOSPITAL_COMMUNITY): Payer: Self-pay | Admitting: Emergency Medicine

## 2019-12-31 DIAGNOSIS — E1165 Type 2 diabetes mellitus with hyperglycemia: Secondary | ICD-10-CM | POA: Diagnosis not present

## 2019-12-31 DIAGNOSIS — Z7984 Long term (current) use of oral hypoglycemic drugs: Secondary | ICD-10-CM | POA: Diagnosis not present

## 2019-12-31 DIAGNOSIS — M79671 Pain in right foot: Secondary | ICD-10-CM | POA: Diagnosis not present

## 2019-12-31 DIAGNOSIS — Z880 Allergy status to penicillin: Secondary | ICD-10-CM

## 2019-12-31 DIAGNOSIS — R079 Chest pain, unspecified: Secondary | ICD-10-CM | POA: Diagnosis not present

## 2019-12-31 DIAGNOSIS — U071 COVID-19: Secondary | ICD-10-CM | POA: Diagnosis present

## 2019-12-31 DIAGNOSIS — I444 Left anterior fascicular block: Secondary | ICD-10-CM | POA: Diagnosis present

## 2019-12-31 DIAGNOSIS — Z8249 Family history of ischemic heart disease and other diseases of the circulatory system: Secondary | ICD-10-CM

## 2019-12-31 DIAGNOSIS — E669 Obesity, unspecified: Secondary | ICD-10-CM | POA: Diagnosis present

## 2019-12-31 DIAGNOSIS — R490 Dysphonia: Secondary | ICD-10-CM | POA: Diagnosis not present

## 2019-12-31 DIAGNOSIS — M79672 Pain in left foot: Secondary | ICD-10-CM | POA: Diagnosis not present

## 2019-12-31 DIAGNOSIS — R1010 Upper abdominal pain, unspecified: Secondary | ICD-10-CM | POA: Diagnosis not present

## 2019-12-31 DIAGNOSIS — K2980 Duodenitis without bleeding: Secondary | ICD-10-CM | POA: Diagnosis present

## 2019-12-31 DIAGNOSIS — Z833 Family history of diabetes mellitus: Secondary | ICD-10-CM | POA: Diagnosis not present

## 2019-12-31 DIAGNOSIS — I248 Other forms of acute ischemic heart disease: Secondary | ICD-10-CM | POA: Diagnosis present

## 2019-12-31 DIAGNOSIS — K219 Gastro-esophageal reflux disease without esophagitis: Secondary | ICD-10-CM | POA: Diagnosis present

## 2019-12-31 DIAGNOSIS — R1013 Epigastric pain: Secondary | ICD-10-CM

## 2019-12-31 DIAGNOSIS — Z881 Allergy status to other antibiotic agents status: Secondary | ICD-10-CM | POA: Diagnosis not present

## 2019-12-31 DIAGNOSIS — E1110 Type 2 diabetes mellitus with ketoacidosis without coma: Principal | ICD-10-CM | POA: Diagnosis present

## 2019-12-31 DIAGNOSIS — N281 Cyst of kidney, acquired: Secondary | ICD-10-CM | POA: Diagnosis present

## 2019-12-31 DIAGNOSIS — K5730 Diverticulosis of large intestine without perforation or abscess without bleeding: Secondary | ICD-10-CM | POA: Diagnosis present

## 2019-12-31 DIAGNOSIS — Z87891 Personal history of nicotine dependence: Secondary | ICD-10-CM | POA: Diagnosis not present

## 2019-12-31 DIAGNOSIS — N179 Acute kidney failure, unspecified: Secondary | ICD-10-CM | POA: Diagnosis present

## 2019-12-31 DIAGNOSIS — Z6837 Body mass index (BMI) 37.0-37.9, adult: Secondary | ICD-10-CM

## 2019-12-31 DIAGNOSIS — R6510 Systemic inflammatory response syndrome (SIRS) of non-infectious origin without acute organ dysfunction: Secondary | ICD-10-CM | POA: Diagnosis present

## 2019-12-31 DIAGNOSIS — R109 Unspecified abdominal pain: Secondary | ICD-10-CM | POA: Diagnosis present

## 2019-12-31 LAB — CBG MONITORING, ED
Glucose-Capillary: 561 mg/dL (ref 70–99)
Glucose-Capillary: >600 mg/dL (ref 70–99)
Glucose-Capillary: 541 mg/dL (ref 70–99)
Glucose-Capillary: 358 mg/dL — ABNORMAL HIGH (ref 70–99)
Glucose-Capillary: 442 mg/dL — ABNORMAL HIGH (ref 70–99)
Glucose-Capillary: 370 mg/dL — ABNORMAL HIGH (ref 70–99)
Glucose-Capillary: 374 mg/dL — ABNORMAL HIGH (ref 70–99)
Glucose-Capillary: 300 mg/dL — ABNORMAL HIGH (ref 70–99)
Glucose-Capillary: 330 mg/dL — ABNORMAL HIGH (ref 70–99)

## 2019-12-31 LAB — CBC WITH DIFFERENTIAL/PLATELET
Abs Immature Granulocytes: 0 10*3/uL (ref 0.00–0.07)
Band Neutrophils: 0 %
Basophils Absolute: 0 10*3/uL (ref 0.0–0.1)
Basophils Relative: 0 %
Blasts: 0 %
Eosinophils Absolute: 0 10*3/uL (ref 0.0–0.5)
Eosinophils Relative: 0 %
HCT: 52.7 % — ABNORMAL HIGH (ref 39.0–52.0)
Hemoglobin: 17.1 g/dL — ABNORMAL HIGH (ref 13.0–17.0)
Lymphocytes Relative: 11 %
Lymphs Abs: 2.1 10*3/uL (ref 0.7–4.0)
MCH: 30.4 pg (ref 26.0–34.0)
MCHC: 32.4 g/dL (ref 30.0–36.0)
MCV: 93.6 fL (ref 80.0–100.0)
Metamyelocytes Relative: 0 %
Monocytes Absolute: 1.8 10*3/uL — ABNORMAL HIGH (ref 0.1–1.0)
Monocytes Relative: 9 %
Myelocytes: 0 %
Neutro Abs: 15.6 10*3/uL — ABNORMAL HIGH (ref 1.7–7.7)
Neutrophils Relative %: 80 %
Other: 0 %
Platelets: 414 10*3/uL — ABNORMAL HIGH (ref 150–400)
Promyelocytes Relative: 0 %
RBC: 5.63 MIL/uL (ref 4.22–5.81)
RDW: 12.5 % (ref 11.5–15.5)
WBC: 19.5 10*3/uL — ABNORMAL HIGH (ref 4.0–10.5)
nRBC: 0 % (ref 0.0–0.2)
nRBC: 0 /100 WBC

## 2019-12-31 LAB — FERRITIN: Ferritin: 1130 ng/mL — ABNORMAL HIGH (ref 24–336)

## 2019-12-31 LAB — I-STAT VENOUS BLOOD GAS, ED
Acid-base deficit: 17 mmol/L — ABNORMAL HIGH (ref 0.0–2.0)
Bicarbonate: 8.5 mmol/L — ABNORMAL LOW (ref 20.0–28.0)
Calcium, Ion: 1.24 mmol/L (ref 1.15–1.40)
HCT: 53 % — ABNORMAL HIGH (ref 39.0–52.0)
Hemoglobin: 18 g/dL — ABNORMAL HIGH (ref 13.0–17.0)
O2 Saturation: 88 %
Potassium: 5.3 mmol/L — ABNORMAL HIGH (ref 3.5–5.1)
Sodium: 131 mmol/L — ABNORMAL LOW (ref 135–145)
TCO2: 9 mmol/L — ABNORMAL LOW (ref 22–32)
pCO2, Ven: 22.4 mmHg — ABNORMAL LOW (ref 44.0–60.0)
pH, Ven: 7.189 — CL (ref 7.250–7.430)
pO2, Ven: 66 mmHg — ABNORMAL HIGH (ref 32.0–45.0)

## 2019-12-31 LAB — CBC
HCT: 55.2 % — ABNORMAL HIGH (ref 39.0–52.0)
HCT: 51.8 % (ref 39.0–52.0)
Hemoglobin: 17.6 g/dL — ABNORMAL HIGH (ref 13.0–17.0)
Hemoglobin: 17.4 g/dL — ABNORMAL HIGH (ref 13.0–17.0)
MCH: 30.2 pg (ref 26.0–34.0)
MCH: 30.2 pg (ref 26.0–34.0)
MCHC: 31.9 g/dL (ref 30.0–36.0)
MCHC: 33.6 g/dL (ref 30.0–36.0)
MCV: 94.7 fL (ref 80.0–100.0)
MCV: 89.8 fL (ref 80.0–100.0)
Platelets: 437 10*3/uL — ABNORMAL HIGH (ref 150–400)
Platelets: 399 10*3/uL (ref 150–400)
RBC: 5.83 MIL/uL — ABNORMAL HIGH (ref 4.22–5.81)
RBC: 5.77 MIL/uL (ref 4.22–5.81)
RDW: 12.7 % (ref 11.5–15.5)
RDW: 12.6 % (ref 11.5–15.5)
WBC: 14.2 10*3/uL — ABNORMAL HIGH (ref 4.0–10.5)
WBC: 15.7 10*3/uL — ABNORMAL HIGH (ref 4.0–10.5)
nRBC: 0 % (ref 0.0–0.2)
nRBC: 0 % (ref 0.0–0.2)

## 2019-12-31 LAB — SARS CORONAVIRUS 2 BY RT PCR (HOSPITAL ORDER, PERFORMED IN ~~LOC~~ HOSPITAL LAB): SARS Coronavirus 2: POSITIVE — AB

## 2019-12-31 LAB — BASIC METABOLIC PANEL
Anion gap: 30 — ABNORMAL HIGH (ref 5–15)
Anion gap: 20 — ABNORMAL HIGH (ref 5–15)
BUN: 26 mg/dL — ABNORMAL HIGH (ref 6–20)
BUN: 24 mg/dL — ABNORMAL HIGH (ref 6–20)
CO2: 11 mmol/L — ABNORMAL LOW (ref 22–32)
CO2: 13 mmol/L — ABNORMAL LOW (ref 22–32)
Calcium: 10.7 mg/dL — ABNORMAL HIGH (ref 8.9–10.3)
Calcium: 10.1 mg/dL (ref 8.9–10.3)
Chloride: 94 mmol/L — ABNORMAL LOW (ref 98–111)
Chloride: 105 mmol/L (ref 98–111)
Creatinine, Ser: 1.9 mg/dL — ABNORMAL HIGH (ref 0.61–1.24)
Creatinine, Ser: 1.42 mg/dL — ABNORMAL HIGH (ref 0.61–1.24)
GFR calc Af Amer: 48 mL/min — ABNORMAL LOW (ref >60)
GFR calc Af Amer: >60 mL/min (ref >60)
GFR calc non Af Amer: 41 mL/min — ABNORMAL LOW (ref >60)
GFR calc non Af Amer: 58 mL/min — ABNORMAL LOW (ref >60)
Glucose, Bld: 507 mg/dL (ref 70–99)
Glucose, Bld: 208 mg/dL — ABNORMAL HIGH (ref 70–99)
Potassium: 5 mmol/L (ref 3.5–5.1)
Potassium: 4.4 mmol/L (ref 3.5–5.1)
Sodium: 135 mmol/L (ref 135–145)
Sodium: 138 mmol/L (ref 135–145)

## 2019-12-31 LAB — HIV ANTIBODY (ROUTINE TESTING W REFLEX): HIV Screen 4th Generation wRfx: NONREACTIVE

## 2019-12-31 LAB — URINALYSIS, ROUTINE W REFLEX MICROSCOPIC
Bacteria, UA: NONE SEEN
Bilirubin Urine: NEGATIVE
Glucose, UA: >=500 mg/dL — AB
Ketones, ur: 80 mg/dL — AB
Leukocytes,Ua: NEGATIVE
Nitrite: NEGATIVE
Protein, ur: 100 mg/dL — AB
Specific Gravity, Urine: 1.023 (ref 1.005–1.030)
pH: 5 (ref 5.0–8.0)

## 2019-12-31 LAB — HEPATIC FUNCTION PANEL
ALT: 29 U/L (ref 0–44)
AST: 24 U/L (ref 15–41)
Albumin: 2.7 g/dL — ABNORMAL LOW (ref 3.5–5.0)
Alkaline Phosphatase: 79 U/L (ref 38–126)
Bilirubin, Direct: 0.1 mg/dL (ref 0.0–0.2)
Indirect Bilirubin: 2.1 mg/dL — ABNORMAL HIGH (ref 0.3–0.9)
Total Bilirubin: 2.2 mg/dL — ABNORMAL HIGH (ref 0.3–1.2)
Total Protein: 7.6 g/dL (ref 6.5–8.1)

## 2019-12-31 LAB — TROPONIN I (HIGH SENSITIVITY)
Troponin I (High Sensitivity): 12 ng/L (ref <18)
Troponin I (High Sensitivity): 8 ng/L (ref <18)

## 2019-12-31 LAB — HEMOGLOBIN A1C
Hgb A1c MFr Bld: 14.6 % — ABNORMAL HIGH (ref 4.8–5.6)
Mean Plasma Glucose: 372.32 mg/dL

## 2019-12-31 LAB — GLUCOSE, CAPILLARY
Glucose-Capillary: 216 mg/dL — ABNORMAL HIGH (ref 70–99)
Glucose-Capillary: 170 mg/dL — ABNORMAL HIGH (ref 70–99)

## 2019-12-31 LAB — TRIGLYCERIDES: Triglycerides: 168 mg/dL — ABNORMAL HIGH (ref <150)

## 2019-12-31 LAB — LACTIC ACID, PLASMA
Lactic Acid, Venous: 4.3 mmol/L (ref 0.5–1.9)
Lactic Acid, Venous: 4.6 mmol/L (ref 0.5–1.9)

## 2019-12-31 LAB — MRSA PCR SCREENING: MRSA by PCR: NEGATIVE

## 2019-12-31 LAB — LACTATE DEHYDROGENASE: LDH: 235 U/L — ABNORMAL HIGH (ref 98–192)

## 2019-12-31 LAB — FIBRINOGEN: Fibrinogen: 695 mg/dL — ABNORMAL HIGH (ref 210–475)

## 2019-12-31 LAB — BETA-HYDROXYBUTYRIC ACID
Beta-Hydroxybutyric Acid: >8 mmol/L — ABNORMAL HIGH (ref 0.05–0.27)
Beta-Hydroxybutyric Acid: 5.07 mmol/L — ABNORMAL HIGH (ref 0.05–0.27)

## 2019-12-31 LAB — PROCALCITONIN: Procalcitonin: 0.3 ng/mL

## 2019-12-31 LAB — C-REACTIVE PROTEIN: CRP: 5.1 mg/dL — ABNORMAL HIGH (ref <1.0)

## 2019-12-31 LAB — D-DIMER, QUANTITATIVE: D-Dimer, Quant: 16.17 ug/mL-FEU — ABNORMAL HIGH (ref 0.00–0.50)

## 2019-12-31 LAB — ABO/RH: ABO/RH(D): A POS

## 2019-12-31 MED ORDER — INSULIN REGULAR(HUMAN) IN NACL 100-0.9 UT/100ML-% IV SOLN
INTRAVENOUS | Status: DC
  Administered 2019-12-31: 13 [IU]/h via INTRAVENOUS
  Filled 2019-12-31: qty 100

## 2019-12-31 MED ORDER — DEXTROSE IN LACTATED RINGERS 5 % IV SOLN: INTRAVENOUS | Status: DC

## 2019-12-31 MED ORDER — ENOXAPARIN SODIUM 40 MG/0.4ML ~~LOC~~ SOLN: 40.0000 mg | SUBCUTANEOUS | Status: DC

## 2019-12-31 MED ORDER — LACTATED RINGERS IV BOLUS
20.0000 mL/kg | Freq: Once | INTRAVENOUS | Status: AC
  Administered 2019-12-31: 2540 mL via INTRAVENOUS

## 2019-12-31 MED ORDER — ALUM & MAG HYDROXIDE-SIMETH 200-200-20 MG/5ML PO SUSP
30.0000 mL | Freq: Once | ORAL | Status: AC
  Administered 2019-12-31: 30 mL via ORAL
  Filled 2019-12-31: qty 30

## 2019-12-31 MED ORDER — LIDOCAINE VISCOUS HCL 2 % MT SOLN
15.0000 mL | Freq: Once | OROMUCOSAL | Status: AC
  Administered 2019-12-31: 15 mL via ORAL
  Filled 2019-12-31: qty 15

## 2019-12-31 MED ORDER — LACTATED RINGERS IV SOLN: INTRAVENOUS | Status: DC

## 2019-12-31 MED ORDER — ENOXAPARIN SODIUM 80 MG/0.8ML ~~LOC~~ SOLN
65.0000 mg | SUBCUTANEOUS | Status: DC
  Administered 2019-12-31: 65 mg via SUBCUTANEOUS
  Filled 2019-12-31: qty 0.65
  Filled 2019-12-31: qty 0.8

## 2019-12-31 MED ORDER — POTASSIUM CHLORIDE 10 MEQ/100ML IV SOLN
10.0000 meq | INTRAVENOUS | Status: AC
  Administered 2019-12-31 (×2): 10 meq via INTRAVENOUS
  Filled 2019-12-31: qty 100

## 2019-12-31 MED ORDER — DEXTROSE 50 % IV SOLN: 0.0000 mL | INTRAVENOUS | Status: DC | PRN

## 2019-12-31 MED ORDER — LACTATED RINGERS IV BOLUS: 20.0000 mL/kg | Freq: Once | INTRAVENOUS | Status: AC

## 2019-12-31 MED ORDER — INSULIN REGULAR(HUMAN) IN NACL 100-0.9 UT/100ML-% IV SOLN
INTRAVENOUS | Status: DC
  Administered 2019-12-31: 7 [IU]/h via INTRAVENOUS
  Filled 2019-12-31: qty 100

## 2019-12-31 MED ORDER — ACETAMINOPHEN 325 MG PO TABS: 650.0000 mg | ORAL_TABLET | Freq: Four times a day (QID) | ORAL | Status: DC | PRN

## 2019-12-31 MED ORDER — PANTOPRAZOLE SODIUM 40 MG IV SOLR
40.0000 mg | Freq: Every day | INTRAVENOUS | Status: DC
  Administered 2019-12-31: 40 mg via INTRAVENOUS
  Filled 2019-12-31: qty 40

## 2019-12-31 NOTE — ED Provider Notes (Signed)
[facility] EMERGENCY DEPARTMENT Provider Note   CSN: [id number] Arrival date & time: 12/31/19  7829     History Chief Complaint  Patient presents with  . COVID  . Heartburn  . Fatigue    Guy Gonzalez is a 49 y.o. male.  HPI   Patient is a 49 year old male with a history of type 2 diabetes mellitus, GERD, obesity.  Patient states about 8 days ago he was diagnosed with COVID-19.  He has been experiencing generalized fatigue, weakness, productive cough with brown mucus, fevers, chills, nausea without vomiting.  He reports a history of GERD and states that he is experiencing significant central chest pain that is constant with associated burping.  States this is similar to his GERD symptoms.  Patient called EMS this morning and per nursing no, patient was found to be hyperglycemic and given a 150 cc normal saline bolus in route.  Glucose upon arrival is 507.  Patient has not been vaccinated for COVID-19.  Patient states he only takes Metformin for his DM and states he has been compliant with his medication.  Nursing staff spoke with his wife who notes patient has been experiencing significant weight loss over the last year.     Past Medical History:  Diagnosis Date  . Diabetes mellitus without complication [facility])     Patient Active Problem List   Diagnosis Date Noted  . HYPERGLYCEMIA 11/13/2008  . OBESITY 04/06/2008  . Allergic rhinitis, cause unspecified 04/06/2008  . ELEVATED BLOOD PRESSURE WITHOUT DIAGNOSIS OF HYPERTENSION 04/06/2008    History reviewed. No pertinent surgical history.     Family History  Problem Relation Age of Onset  . Healthy Mother   . Diabetes Father   . Heart failure Father     Social History   Tobacco Use  . Smoking status: Former Smoker    Types: Cigarettes    Quit date: 05/11/2000    Years since quitting: 19.6  . Smokeless tobacco: Never Used  Substance Use Topics  . Alcohol use: Yes    Comment: socially  . Drug use:  No    Home Medications Prior to Admission medications   Medication Sig Start Date End Date Taking? Authorizing Provider  benzonatate (TESSALON) 100 MG capsule Take 1-2 capsules (100-200 mg total) by mouth 3 (three) times daily as needed. 12/16/19   Wallis Bamberg, PA-C  cetirizine (ZYRTEC ALLERGY) 10 MG tablet Take 1 tablet (10 mg total) by mouth daily. 12/16/19   Wallis Bamberg, PA-C  fluticasone (FLONASE) 50 MCG/ACT nasal spray Place 2 sprays into both nostrils daily. 08/14/19   Eustace Moore, MD  ibuprofen (ADVIL) 800 MG tablet Take 1 tablet (800 mg total) by mouth 3 (three) times daily. 09/22/19   Lamptey, Britta Mccreedy, MD  loratadine-pseudoephedrine (CLARITIN-D 12 HOUR) 5-120 MG tablet Take 1 tablet by mouth 2 (two) times daily. 08/14/19   Eustace Moore, MD  metFORMIN (GLUCOPHAGE) 1000 MG tablet Take 1 tablet (1,000 mg total) by mouth 2 (two) times daily with a meal. 12/16/19 03/15/20  Wallis Bamberg, PA-C  naproxen (NAPROSYN) 500 MG tablet Take 1 tablet (500 mg total) by mouth 2 (two) times daily with a meal. 08/18/19   Garlon Hatchet, PA-C  promethazine-dextromethorphan (PROMETHAZINE-DM) 6.25-15 MG/5ML syrup Take 5 mLs by mouth at bedtime as needed for cough. 12/16/19   Wallis Bamberg, PA-C  hydrochlorothiazide (HYDRODIURIL) 12.5 MG tablet Take 1 tablet (12.5 mg total) by mouth daily. 10/22/17 08/14/19  Lurene Shadow, PA-C  ipratropium (ATROVENT) 0.06 % nasal spray Place 2 sprays into both nostrils 4 (four) times daily. 05/09/18 08/14/19  Belinda Fisher, PA-C    Allergies    Penicillins and Cephalexin  Review of Systems   Review of Systems  All other systems reviewed and are negative. Ten systems reviewed and are negative for acute change, except as noted in the HPI.   Physical Exam Updated Vital Signs BP (!) 133/102 (BP Location: Right Arm)   Pulse (!) 138   Temp (!) 97.5 F (36.4 C) (Oral)   Resp (!) 24   Ht 6' (1.829 m)   Wt 127 kg   SpO2 100%   BMI 37.97 kg/m   Physical Exam Vitals and  nursing note reviewed.  Constitutional:      General: He is in acute distress.     Appearance: Normal appearance. He is obese. He is ill-appearing. He is not toxic-appearing or diaphoretic.     Comments: Well-developed but obese male.  He is tachypneic, tachycardic, fatigue.  He answers questions appropriately but due to fatigue, does so in a whispered tone.  Patient has difficulty sitting up in bed secondary to fatigue/weakness.  HENT:     Head: Normocephalic and atraumatic.     Right Ear: External ear normal.     Left Ear: External ear normal.     Nose: Nose normal.     Mouth/Throat:     Mouth: Mucous membranes are dry.     Pharynx: Oropharynx is clear. No oropharyngeal exudate or posterior oropharyngeal erythema.  Eyes:     General: No scleral icterus.       Right eye: No discharge.        Left eye: No discharge.     Extraocular Movements: Extraocular movements intact.     Conjunctiva/sclera: Conjunctivae normal.  Cardiovascular:     Rate and Rhythm: Regular rhythm. Tachycardia present.     Pulses: Normal pulses.     Heart sounds: Normal heart sounds. No murmur heard.  No friction rub. No gallop.   Pulmonary:     Effort: Pulmonary effort is normal. No respiratory distress.     Breath sounds: No stridor. No wheezing, rhonchi or rales.     Comments: Tachypneic in the mid 20s.  Crackles noted in the bilateral lung bases. Abdominal:     General: Abdomen is flat.     Palpations: Abdomen is soft.     Tenderness: There is abdominal tenderness.     Comments: Abdomen is soft with mild diffuse tenderness noted with deep palpation.  Musculoskeletal:        General: Normal range of motion.     Cervical back: Normal range of motion and neck supple. No tenderness.     Right lower leg: No edema.     Left lower leg: No edema.  Skin:    General: Skin is warm and dry.  Neurological:     General: No focal deficit present.     Mental Status: He is alert and oriented to person, place, and  time.  Psychiatric:        Mood and Affect: Mood normal.        Behavior: Behavior normal.    ED Results / Procedures / Treatments   Labs (all labs ordered are listed, but only abnormal results are displayed) Labs Reviewed  SARS CORONAVIRUS 2 BY RT PCR (HOSPITAL ORDER, PERFORMED IN [facility]) - Abnormal; Notable for the following components:  Result Value   SARS Coronavirus 2 POSITIVE (*)    All other components within normal limits  BASIC METABOLIC PANEL - Abnormal; Notable for the following components:   Chloride 94 (*)    CO2 11 (*)    Glucose, Bld 507 (*)    BUN 26 (*)    Creatinine, Ser 1.90 (*)    Calcium 10.7 (*)    GFR calc non Af Amer 41 (*)    GFR calc Af Amer 48 (*)    Anion gap 30 (*)    All other components within normal limits  CBC - Abnormal; Notable for the following components:   WBC 14.2 (*)    RBC 5.83 (*)    Hemoglobin 17.6 (*)    HCT 55.2 (*)    Platelets 437 (*)    All other components within normal limits  BETA-HYDROXYBUTYRIC ACID - Abnormal; Notable for the following components:   Beta-Hydroxybutyric Acid >8.00 (*)    All other components within normal limits  URINALYSIS, ROUTINE W REFLEX MICROSCOPIC - Abnormal; Notable for the following components:   Color, Urine STRAW (*)    Glucose, UA >=500 (*)    Hgb urine dipstick MODERATE (*)    Ketones, ur 80 (*)    Protein, ur 100 (*)    All other components within normal limits  LACTIC ACID, PLASMA - Abnormal; Notable for the following components:   Lactic Acid, Venous 4.3 (*)    All other components within normal limits  HEPATIC FUNCTION PANEL - Abnormal; Notable for the following components:   Albumin 2.7 (*)    Total Bilirubin 2.2 (*)    Indirect Bilirubin 2.1 (*)    All other components within normal limits  D-DIMER, QUANTITATIVE (NOT AT [facility]) - Abnormal; Notable for the following components:   D-Dimer, Quant 16.17 (*)    All other components within normal limits  LACTATE  DEHYDROGENASE - Abnormal; Notable for the following components:   LDH 235 (*)    All other components within normal limits  FERRITIN - Abnormal; Notable for the following components:   Ferritin 1,130 (*)    All other components within normal limits  TRIGLYCERIDES - Abnormal; Notable for the following components:   Triglycerides 168 (*)    All other components within normal limits  FIBRINOGEN - Abnormal; Notable for the following components:   Fibrinogen 695 (*)    All other components within normal limits  C-REACTIVE PROTEIN - Abnormal; Notable for the following components:   CRP 5.1 (*)    All other components within normal limits  CBC WITH DIFFERENTIAL/PLATELET - Abnormal; Notable for the following components:   WBC 19.5 (*)    Hemoglobin 17.1 (*)    HCT 52.7 (*)    Platelets 414 (*)    Neutro Abs 15.6 (*)    Monocytes Absolute 1.8 (*)    All other components within normal limits  CBG MONITORING, ED - Abnormal; Notable for the following components:   Glucose-Capillary 561 (*)    All other components within normal limits  I-STAT VENOUS BLOOD GAS, ED - Abnormal; Notable for the following components:   pH, Ven 7.189 (*)    pCO2, Ven 22.4 (*)    pO2, Ven 66.0 (*)    Bicarbonate 8.5 (*)    TCO2 9 (*)    Acid-base deficit 17.0 (*)    Sodium 131 (*)    Potassium 5.3 (*)    HCT 53.0 (*)    Hemoglobin 18.0 (*)  All other components within normal limits  CBG MONITORING, ED - Abnormal; Notable for the following components:   Glucose-Capillary >600 (*)    All other components within normal limits  CBG MONITORING, ED - Abnormal; Notable for the following components:   Glucose-Capillary 541 (*)    All other components within normal limits  CBG MONITORING, ED - Abnormal; Notable for the following components:   Glucose-Capillary 442 (*)    All other components within normal limits  URINE CULTURE  CULTURE, BLOOD (ROUTINE X 2)  CULTURE, BLOOD (ROUTINE X 2)  PROCALCITONIN   BETA-HYDROXYBUTYRIC ACID  BLOOD GAS, VENOUS  LACTIC ACID, PLASMA  CBC WITH DIFFERENTIAL/PLATELET  BETA-HYDROXYBUTYRIC ACID  TROPONIN I (HIGH SENSITIVITY)  TROPONIN I (HIGH SENSITIVITY)   EKG EKG Interpretation  Date/Time:  Saturday December 31 2019 06:59:34 EDT Ventricular Rate:  132 PR Interval:  126 QRS Duration: 76 QT Interval:  306 QTC Calculation: 453 R Axis:   -65 Text Interpretation: Sinus tachycardia Left anterior fascicular block Abnormal ECG increased rate and peaked t waves compared with prior 7/12 Confirmed by Meridee ScoreButler, Michael [phone]([id number]) on 12/31/2019 10:45:25 AM  Radiology DG Chest Portable 1 View  Result Date: 12/31/2019 CLINICAL DATA:  Shortness of breath. EXAM: PORTABLE CHEST 1 VIEW COMPARISON:  December 16, 2019 FINDINGS: A linear metallic structure overlies the lower right neck, extending to midline. This is probably something outside the patient. The heart, hila, and mediastinum are normal. No pneumothorax. No nodules or masses. No focal infiltrates. IMPRESSION: 1. A linear metallic foreign body overlies the lower right neck and upper chest, likely outside the patient. Recommend clinical correlation. 2. No pulmonary infiltrate/pneumonia identified. No other abnormalities. Electronically Signed   By: Gerome Samavid  Williams III M.D   On: 12/31/2019 08:25   Procedures .Critical Care Performed by: Placido SouJoldersma, Merry [place]ond, PA-C Authorized by: Placido SouJoldersma, Marke Goodwyn, PA-C   Critical care provider statement:    Critical care time (minutes):  45   Critical care was necessary to treat or prevent imminent or life-threatening deterioration of the following conditions: DKA.   Critical care was time spent personally by me on the following activities:  Discussions with consultants, evaluation of patient's response to treatment, examination of patient, ordering and performing treatments and interventions, ordering and review of laboratory studies, ordering and review of radiographic studies, pulse  oximetry, re-evaluation of patient's condition, obtaining history from patient or surrogate and review of old charts   Medications Ordered in ED Medications  insulin regular, human (MYXREDLIN) 100 units/ 100 mL infusion (10.5 Units/hr Intravenous Rate/Dose Change 12/31/19 1353)  lactated ringers infusion (has no administration in time range)  dextrose 5 % in lactated ringers infusion (has no administration in time range)  dextrose 50 % solution 0-50 mL (has no administration in time range)  potassium chloride 10 mEq in 100 mL IVPB (10 mEq Intravenous New Bag/Given 12/31/19 1352)  lactated ringers bolus 2,540 mL (2,540 mLs Intravenous New Bag/Given 12/31/19 1013)  alum & mag hydroxide-simeth (MAALOX/MYLANTA) 200-200-20 MG/5ML suspension 30 mL (30 mLs Oral Given 12/31/19 1236)    And  lidocaine (XYLOCAINE) 2 % viscous mouth solution 15 mL (15 mLs Oral Given 12/31/19 1239)    ED Course  I have reviewed the triage vital signs and the nursing notes.  Pertinent labs & imaging results that were available during my care of the patient were reviewed by me and considered in my medical decision making (see chart for details).  Clinical Course as of Dec 31 1638  Sat Dec 31, 2019  5427 Potassium: 5.0 [LJ]  0938 Glucose(!!): 507 [LJ]  0938 Creatinine(!): 1.90 [LJ]  0957 1. A linear metallic foreign body overlies the lower right neck and upper chest, likely outside the patient. Recommend clinical correlation. 2. No pulmonary infiltrate/pneumonia identified. No other abnormalities.  DG Chest Portable 1 View [LJ]  1058 Anion gap(!): 30 [LJ]  1058 pH, Ven(!!): 7.189 [LJ]  1157 WBC(!): 14.2 [LJ]  237 49 year old male diabetic here known Covid positive complaining of fatigue cough heartburn.  Labs showing elevated white count low bicarb elevated blood sugar acidosis consistent with DKA.  Getting fluids insulin.  Will need admission to the hospital.   [MB]  1216 SARS Coronavirus 2(!): POSITIVE [LJ]  1243  Patient reassessed and states he is still experiencing significant burping and regurgitation.  Will give patient a GI cocktail.   [LJ]  1258 LDH(!): 235 [LJ]  1258 D-Dimer, Quant(!): 16.17 [LJ]  1258 Fibrinogen(!): 695 [LJ]  1258 Lactic Acid, Venous(!!): 4.3 [LJ]  1258 Triglycerides(!): 168 [LJ]  1310 Patient states he is feeling much better since having a GI cocktail.   [LJ]  1413 Glucose, UA(!): >=500 [LJ]  1413 Ketones, ur(!): 80 [LJ]  1413 Protein(!): 100 [LJ]  1430 Glucose-Capillary(!): 358 [LJ]    Clinical Course User Index [LJ] Placido Sou, PA-C [MB] Terrilee Files, MD   MDM Rules/Calculators/A&P                          Pt is a 49 y.o. male that presents with a history, physical exam, and ED Clinical Course as noted above.   Patient presents today in DKA and is also positive for COVID-19.  Patient started on Endo tool.  GI cocktail for his GERD with relief.  Will discuss with hospitalist team for admission.  Note: Portions of this report may have been transcribed using voice recognition software. Every effort was made to ensure accuracy; however, inadvertent computerized transcription errors may be present.   Final Clinical Impression(s) / ED Diagnoses Final diagnoses:  Diabetic ketoacidosis without coma associated with type 2 diabetes mellitus (HCC)  COVID-19   Rx / DC Orders ED Discharge Orders    None       Placido Sou, PA-C 12/31/19 1640    Tilden Fossa, MD 01/01/20 [phone]

## 2019-12-31 NOTE — ED Triage Notes (Signed)
Per EMS, pt from home, c/o increased fatigue, acid reflux and coughing up brown mucus. States he was diagnosed w/ COVID 8 days ago.  Given 150cc NS b/c his CBG was 361.    HR 124 RR 18 97% RA 18G L AC

## 2019-12-31 NOTE — ED Notes (Signed)
Per lab + Covid 

## 2019-12-31 NOTE — ED Notes (Signed)
LACTIC-4.3

## 2019-12-31 NOTE — H&P (Addendum)
Family Medicine Teaching [facility] Admission History and Physical Service Pager: [phone]  Patient name: Guy Gonzalez Medical record number: [id number] Date of birth: [birth year] Age: 49 y.o. Gender: male  Primary Care Provider: Patient, No Pcp Per Consultants: None Code Status: Full Code  Preferred Emergency Contact: Wife Guy Gonzalez [phone]  Chief Complaint: GERD, COVID and hyperglycemia  Assessment and Plan: Guy Gonzalez is a 49 y.o. male presenting COVID-19 infection and hyperglycemia in setting of DKA. PMH is significant for T2 DM, GERD, obesity.  DKA CBG at presentation 561.  DKA is likely secondary to poor diabetes regimen.  Per patient he has actively been trying to lose weight this year he was 400+ pounds.  Patient has had no assistance with changing his diabetic regimen as he has had weight loss.  He reported that his BGL has been uncontrolled more recently.  Most recent CBG 358.  AG 30.  HR range 125-138.  Most recent HR 125.  RR 21-29.  BP range 133/102-150/103.  Most recent BP 140/97.  Patient meets SIRS criteria.  BHB>8. Lactic acid, 4.3.  TG 168.  ABG pH 7.1, PCO2 22.4, PO2 66, HCO3 8.5.  UA positive for ketonuria, glucosuria, and proteinuria. In setting of changing insulin demands, believe that acute COVID-19 infection likely triggered onset of DKA.  -Endo tool -BMP q 4 -CBG q 2h -Insulin gtt. -mIVF D5 LR at 125 mL/h - check Hgb A1c  -Consider adding linagliptin when A/G gap closes, in setting of COVID   COVID-19 positive Patient was experiencing generalized fatigue, weakness, fevers, chills and productive cough with brown mucus.  Patient has not been vaccinated for COVID-19.  Patient was diagnosed with COVID 8 days prior to presentation to ED. Confirmatory COVID test in ED is positive.  WBC at presentation elevated at 14.2>19.5, Hgb 17.6>17.1, PLT 437>414. Patient meets SIRS criteria at presentation with elevated blood pressure, tachypnea, and tachycardia.   Albumin 2.7, T bili 2.2, indirect bili 2.1. AST, ALT, ALP within normal limits.  D-dimer 16.17, pro-Cal 0.3,LDH 135, ferritin 1130, fibrinogen 695, CRP 5.1.  Patient currently satting >95% on RA.  CXR on presentation not significant for pulmonary infiltrate/pneumonia.  Patient does meet SIRS criteria although this is likely 2/2 DKA diagnoses. -Admit to progressive, FPTS, Dr. Pollie Meyer -f/u Bcx x2 -Continuous pulse ox -We will not give Decadron at this time due to DKA and no requirement for supplemental oxygen    GERD Received GI cocktail in the ED and responded well.  Patient does not have recent history of GERD.  Noted to have reflux and frequent belching during interview. -encourage patient to sit patient upright in bed -Protonix 40 IV QD  Chest pain TRP 12>8.  EKG at admission sinus tachycardia.  Chest pain is likely 2/2 increased demand on the heart from DKA, could also consider severe GERD symptoms and cause of described pain.  Chest pain is located around the xiphoid process right above the epigastric area.  Low suspicion for ACS at this time as patient's troponins are WNL and decreasing.   - will monitor for recurrent chest pain  - patient on protonix for GERD symptoms   FEN/GI: N.p.o. until able to transition to subcutaneous insulin   Prophylaxis: Heparin  Disposition: Progressive  History of Present Illness:  Guy Gonzalez is a 49 y.o. male presenting with COVID-19 positive DKA.  Patient reported to ED with significant chest pain in the setting of COVID-19 positive test.  He reported decreased appetite for several days  since being diagnosed with COVID and has had intermittent SOB.  Patient reports that he has had intermittent chest pain in the center of his chest but neat the sternum and xiphoid process.  He also reports significant reflux that started within the past 3 days.  He is also had some abdominal pain and during the interview began having intermittent RUQ pain.  Patient  attributed new RUQ pain to being "thirsty."  Patient reported that he was diagnosed with diabetes "a few years ago."  He was originally 448 pounds and has been intentionally losing weight however he has not had any help with readjusting his diabetes medication regimen as he has had this weight loss.  Since he has had this weight loss he has noticed that his blood sugars have been uncontrolled. Patient was not vaccinated and does not currently have a PCP.  His wife also had COVID-19.   Review Of Systems: Per HPI with the following additions:   Review of Systems  Constitutional: Positive for appetite change and unexpected weight change.  Respiratory: Positive for shortness of breath.   Cardiovascular: Positive for chest pain.  Gastrointestinal: Positive for abdominal pain.       GERD, belching   Genitourinary: Negative for difficulty urinating.     Patient Active Problem List   Diagnosis Date Noted  . HYPERGLYCEMIA 11/13/2008  . OBESITY 04/06/2008  . Allergic rhinitis, cause unspecified 04/06/2008  . ELEVATED BLOOD PRESSURE WITHOUT DIAGNOSIS OF HYPERTENSION 04/06/2008    Past Medical History: Past Medical History:  Diagnosis Date  . Diabetes mellitus without complication [facility])     Past Surgical History: History reviewed. No pertinent surgical history.  Social History: Social History   Tobacco Use  . Smoking status: Former Smoker    Types: Cigarettes    Quit date: 05/11/2000    Years since quitting: 19.6  . Smokeless tobacco: Never Used  Substance Use Topics  . Alcohol use: Yes    Comment: socially  . Drug use: No    Please also refer to relevant sections of EMR.  Family History: Family History  Problem Relation Age of Onset  . Healthy Mother   . Diabetes Father   . Heart failure Father     Allergies and Medications: Allergies  Allergen Reactions  . Penicillins Swelling  . Cephalexin Rash   No current facility-administered medications on file prior to  encounter.   Current Outpatient Medications on File Prior to Encounter  Medication Sig Dispense Refill  . metFORMIN (GLUCOPHAGE) 1000 MG tablet Take 1 tablet (1,000 mg total) by mouth 2 (two) times daily with a meal. 180 tablet 0  . benzonatate (TESSALON) 100 MG capsule Take 1-2 capsules (100-200 mg total) by mouth 3 (three) times daily as needed. (Patient not taking: Reported on 12/31/2019) 60 capsule 0  . cetirizine (ZYRTEC ALLERGY) 10 MG tablet Take 1 tablet (10 mg total) by mouth daily. (Patient not taking: Reported on 12/31/2019) 30 tablet 0  . fluticasone (FLONASE) 50 MCG/ACT nasal spray Place 2 sprays into both nostrils daily. (Patient not taking: Reported on 12/31/2019) 16 g 0  . ibuprofen (ADVIL) 800 MG tablet Take 1 tablet (800 mg total) by mouth 3 (three) times daily. (Patient not taking: Reported on 12/31/2019) 21 tablet 0  . loratadine-pseudoephedrine (CLARITIN-D 12 HOUR) 5-120 MG tablet Take 1 tablet by mouth 2 (two) times daily. (Patient not taking: Reported on 12/31/2019) 14 tablet 0  . naproxen (NAPROSYN) 500 MG tablet Take 1 tablet (500 mg total) by mouth  2 (two) times daily with a meal. (Patient not taking: Reported on 12/31/2019) 30 tablet 0  . promethazine-dextromethorphan (PROMETHAZINE-DM) 6.25-15 MG/5ML syrup Take 5 mLs by mouth at bedtime as needed for cough. (Patient not taking: Reported on 12/31/2019) 100 mL 0  . [DISCONTINUED] hydrochlorothiazide (HYDRODIURIL) 12.5 MG tablet Take 1 tablet (12.5 mg total) by mouth daily. 30 tablet 0  . [DISCONTINUED] ipratropium (ATROVENT) 0.06 % nasal spray Place 2 sprays into both nostrils 4 (four) times daily. 15 mL 0    Objective: BP (!) 148/97   Pulse (!) 125   Temp (!) 97.3 F (36.3 C) (Oral)   Resp (!) 29   Ht 6\' 4"  (1.93 m)   Wt 127 kg   SpO2 97%   BMI 34.08 kg/m   Exam: General: obese male, weight loss evident, lying in bed and uncomfortable appearing, low-tone in obvious discomfort when speaking and throughout exam and  interview Eyes: EOM intact ENTM: Dry mucous membranes Neck: No cervical lymphadenopathy noted Cardiovascular: Tachycardic, regular rhythm, no murmur detected Respiratory: Clear and equal bilaterally Gastrointestinal: Pain to palpation, good work of breathing, obviously having reflux had to sit patient upright during physical exam, nauseous during exam had to have bowl in front of him in preparation for vomit MSK: No pain to palpation of the sternum, 5/5 BLE, 5/5 bilateral grip strength, 5/5 dorsi and plantar flexion Neuro: No cranial nerve deficits noted   Labs and Imaging: CBC BMET  Recent Labs  Lab 12/31/19 1127 12/31/19 1127 12/31/19 1148  WBC 19.5*  --   --   HGB 17.1*   < > 18.0*  HCT 52.7*   < > 53.0*  PLT 414*  --   --    < > = values in this interval not displayed.   Recent Labs  Lab 12/31/19 0724 12/31/19 0724 12/31/19 1148  NA 135   < > 131*  K 5.0   < > 5.3*  CL 94*  --   --   CO2 11*  --   --   BUN 26*  --   --   CREATININE 1.90*  --   --   GLUCOSE 507*  --   --   CALCIUM 10.7*  --   --    < > = values in this interval not displayed.     EKG: Sinus 01/02/20, MD 12/31/2019, 2:58 PM PGY-1, [facility] Family Medicine FPTS Intern pager: [phone], text pages welcome  FPTS Upper-Level Resident Addendum   I have independently interviewed and examined the patient. I have discussed the above with the original author and agree with their documentation. My edits for correction/addition/clarification are included. Please see also any attending notes.   [phone], MD PGY-2, [place] [facility] 12/31/2019 10:15 PM  FPTS Service pager: [phone] (text pages welcome through [employer])

## 2019-12-31 NOTE — ED Notes (Signed)
Report attempted 

## 2019-12-31 NOTE — ED Notes (Signed)
Glucose 507

## 2020-01-01 DIAGNOSIS — E1110 Type 2 diabetes mellitus with ketoacidosis without coma: Principal | ICD-10-CM

## 2020-01-01 DIAGNOSIS — U071 COVID-19: Secondary | ICD-10-CM

## 2020-01-01 LAB — COMPREHENSIVE METABOLIC PANEL
ALT: 21 U/L (ref 0–44)
AST: 19 U/L (ref 15–41)
Albumin: 2.3 g/dL — ABNORMAL LOW (ref 3.5–5.0)
Alkaline Phosphatase: 58 U/L (ref 38–126)
Anion gap: 11 (ref 5–15)
BUN: 16 mg/dL (ref 6–20)
CO2: 19 mmol/L — ABNORMAL LOW (ref 22–32)
Calcium: 9.3 mg/dL (ref 8.9–10.3)
Chloride: 103 mmol/L (ref 98–111)
Creatinine, Ser: 1.23 mg/dL (ref 0.61–1.24)
GFR calc Af Amer: >60 mL/min (ref >60)
GFR calc non Af Amer: >60 mL/min (ref >60)
Glucose, Bld: 407 mg/dL — ABNORMAL HIGH (ref 70–99)
Potassium: 4.5 mmol/L (ref 3.5–5.1)
Sodium: 133 mmol/L — ABNORMAL LOW (ref 135–145)
Total Bilirubin: 1.1 mg/dL (ref 0.3–1.2)
Total Protein: 6.2 g/dL — ABNORMAL LOW (ref 6.5–8.1)

## 2020-01-01 LAB — GLUCOSE, CAPILLARY
Glucose-Capillary: 147 mg/dL — ABNORMAL HIGH (ref 70–99)
Glucose-Capillary: 156 mg/dL — ABNORMAL HIGH (ref 70–99)
Glucose-Capillary: 156 mg/dL — ABNORMAL HIGH (ref 70–99)
Glucose-Capillary: 158 mg/dL — ABNORMAL HIGH (ref 70–99)
Glucose-Capillary: 176 mg/dL — ABNORMAL HIGH (ref 70–99)
Glucose-Capillary: 176 mg/dL — ABNORMAL HIGH (ref 70–99)
Glucose-Capillary: 177 mg/dL — ABNORMAL HIGH (ref 70–99)
Glucose-Capillary: 183 mg/dL — ABNORMAL HIGH (ref 70–99)
Glucose-Capillary: 193 mg/dL — ABNORMAL HIGH (ref 70–99)
Glucose-Capillary: 193 mg/dL — ABNORMAL HIGH (ref 70–99)
Glucose-Capillary: 223 mg/dL — ABNORMAL HIGH (ref 70–99)
Glucose-Capillary: 225 mg/dL — ABNORMAL HIGH (ref 70–99)
Glucose-Capillary: 402 mg/dL — ABNORMAL HIGH (ref 70–99)
Glucose-Capillary: 416 mg/dL — ABNORMAL HIGH (ref 70–99)
Glucose-Capillary: 437 mg/dL — ABNORMAL HIGH (ref 70–99)

## 2020-01-01 LAB — BASIC METABOLIC PANEL
Anion gap: 14 (ref 5–15)
Anion gap: 11 (ref 5–15)
BUN: 20 mg/dL (ref 6–20)
BUN: 19 mg/dL (ref 6–20)
CO2: 19 mmol/L — ABNORMAL LOW (ref 22–32)
CO2: 22 mmol/L (ref 22–32)
Calcium: 9.7 mg/dL (ref 8.9–10.3)
Calcium: 9.9 mg/dL (ref 8.9–10.3)
Chloride: 105 mmol/L (ref 98–111)
Chloride: 105 mmol/L (ref 98–111)
Creatinine, Ser: 1.18 mg/dL (ref 0.61–1.24)
Creatinine, Ser: 1.12 mg/dL (ref 0.61–1.24)
GFR calc Af Amer: >60 mL/min (ref >60)
GFR calc Af Amer: >60 mL/min (ref >60)
GFR calc non Af Amer: >60 mL/min (ref >60)
GFR calc non Af Amer: >60 mL/min (ref >60)
Glucose, Bld: 207 mg/dL — ABNORMAL HIGH (ref 70–99)
Glucose, Bld: 151 mg/dL — ABNORMAL HIGH (ref 70–99)
Potassium: 4.3 mmol/L (ref 3.5–5.1)
Potassium: 4.2 mmol/L (ref 3.5–5.1)
Sodium: 138 mmol/L (ref 135–145)
Sodium: 138 mmol/L (ref 135–145)

## 2020-01-01 LAB — URINE CULTURE: Culture: NO GROWTH

## 2020-01-01 LAB — D-DIMER, QUANTITATIVE: D-Dimer, Quant: 5.86 ug/mL-FEU — ABNORMAL HIGH (ref 0.00–0.50)

## 2020-01-01 LAB — BETA-HYDROXYBUTYRIC ACID
Beta-Hydroxybutyric Acid: 3.28 mmol/L — ABNORMAL HIGH (ref 0.05–0.27)
Beta-Hydroxybutyric Acid: 1.05 mmol/L — ABNORMAL HIGH (ref 0.05–0.27)

## 2020-01-01 MED ORDER — INSULIN ASPART 100 UNIT/ML ~~LOC~~ SOLN
15.0000 [IU] | Freq: Once | SUBCUTANEOUS | Status: AC
  Administered 2020-01-01: 15 [IU] via SUBCUTANEOUS

## 2020-01-01 MED ORDER — ALBUTEROL SULFATE HFA 108 (90 BASE) MCG/ACT IN AERS
2.0000 | INHALATION_SPRAY | Freq: Once | RESPIRATORY_TRACT | Status: DC | PRN
  Filled 2020-01-01: qty 6.7

## 2020-01-01 MED ORDER — LINAGLIPTIN 5 MG PO TABS
5.0000 mg | ORAL_TABLET | Freq: Every day | ORAL | Status: DC
  Administered 2020-01-01 – 2020-01-06 (×6): 5 mg via ORAL
  Filled 2020-01-01 (×6): qty 1

## 2020-01-01 MED ORDER — DIPHENHYDRAMINE HCL 50 MG/ML IJ SOLN: 50.0000 mg | Freq: Once | INTRAMUSCULAR | Status: DC | PRN

## 2020-01-01 MED ORDER — ENOXAPARIN SODIUM 60 MG/0.6ML ~~LOC~~ SOLN
60.0000 mg | SUBCUTANEOUS | Status: DC
  Administered 2020-01-01 – 2020-01-09 (×9): 60 mg via SUBCUTANEOUS
  Filled 2020-01-01 (×9): qty 0.6

## 2020-01-01 MED ORDER — EPINEPHRINE 0.3 MG/0.3ML IJ SOAJ
0.3000 mg | Freq: Once | INTRAMUSCULAR | Status: DC | PRN
  Filled 2020-01-01: qty 0.6

## 2020-01-01 MED ORDER — INSULIN ASPART 100 UNIT/ML ~~LOC~~ SOLN: 0.0000 [IU] | Freq: Every day | SUBCUTANEOUS | Status: DC

## 2020-01-01 MED ORDER — INSULIN ASPART 100 UNIT/ML ~~LOC~~ SOLN: 0.0000 [IU] | Freq: Three times a day (TID) | SUBCUTANEOUS | Status: DC

## 2020-01-01 MED ORDER — INSULIN GLARGINE 100 UNIT/ML ~~LOC~~ SOLN
40.0000 [IU] | Freq: Every day | SUBCUTANEOUS | Status: DC
  Administered 2020-01-02 – 2020-01-03 (×2): 40 [IU] via SUBCUTANEOUS
  Filled 2020-01-01 (×2): qty 0.4

## 2020-01-01 MED ORDER — INSULIN ASPART 100 UNIT/ML ~~LOC~~ SOLN
12.0000 [IU] | Freq: Once | SUBCUTANEOUS | Status: AC
  Administered 2020-01-01: 12 [IU] via SUBCUTANEOUS

## 2020-01-01 MED ORDER — FAMOTIDINE IN NACL 20-0.9 MG/50ML-% IV SOLN
20.0000 mg | Freq: Once | INTRAVENOUS | Status: DC | PRN
  Filled 2020-01-01: qty 50

## 2020-01-01 MED ORDER — INSULIN GLARGINE 100 UNIT/ML ~~LOC~~ SOLN
20.0000 [IU] | Freq: Every day | SUBCUTANEOUS | Status: DC
  Administered 2020-01-01: 20 [IU] via SUBCUTANEOUS
  Filled 2020-01-01 (×2): qty 0.2

## 2020-01-01 MED ORDER — ALUM & MAG HYDROXIDE-SIMETH 200-200-20 MG/5ML PO SUSP
15.0000 mL | ORAL | Status: DC | PRN
  Administered 2020-01-01 – 2020-01-09 (×7): 15 mL via ORAL
  Filled 2020-01-01 (×8): qty 30

## 2020-01-01 MED ORDER — SODIUM CHLORIDE 0.9 % IV SOLN
1200.0000 mg | Freq: Once | INTRAVENOUS | Status: DC
  Filled 2020-01-01: qty 10

## 2020-01-01 MED ORDER — SUCRALFATE 1 GM/10ML PO SUSP
1.0000 g | Freq: Three times a day (TID) | ORAL | Status: DC
  Administered 2020-01-01 – 2020-01-11 (×38): 1 g via ORAL
  Filled 2020-01-01 (×41): qty 10

## 2020-01-01 MED ORDER — PANTOPRAZOLE SODIUM 40 MG PO TBEC
40.0000 mg | DELAYED_RELEASE_TABLET | Freq: Every day | ORAL | Status: DC
  Administered 2020-01-01 – 2020-01-11 (×11): 40 mg via ORAL
  Filled 2020-01-01 (×11): qty 1

## 2020-01-01 MED ORDER — INSULIN ASPART 100 UNIT/ML ~~LOC~~ SOLN
0.0000 [IU] | Freq: Three times a day (TID) | SUBCUTANEOUS | Status: DC
  Administered 2020-01-02: 8 [IU] via SUBCUTANEOUS
  Administered 2020-01-02 (×2): 11 [IU] via SUBCUTANEOUS
  Administered 2020-01-03: 3 [IU] via SUBCUTANEOUS
  Administered 2020-01-03: 8 [IU] via SUBCUTANEOUS
  Administered 2020-01-03: 11 [IU] via SUBCUTANEOUS
  Administered 2020-01-04: 5 [IU] via SUBCUTANEOUS
  Administered 2020-01-04: 11 [IU] via SUBCUTANEOUS
  Administered 2020-01-05: 2 [IU] via SUBCUTANEOUS
  Administered 2020-01-05 (×2): 5 [IU] via SUBCUTANEOUS
  Administered 2020-01-06: 3 [IU] via SUBCUTANEOUS
  Administered 2020-01-06: 2 [IU] via SUBCUTANEOUS
  Administered 2020-01-06: 8 [IU] via SUBCUTANEOUS
  Administered 2020-01-07: 3 [IU] via SUBCUTANEOUS
  Administered 2020-01-07 (×2): 8 [IU] via SUBCUTANEOUS
  Administered 2020-01-08: 2 [IU] via SUBCUTANEOUS
  Administered 2020-01-08 – 2020-01-09 (×2): 5 [IU] via SUBCUTANEOUS
  Administered 2020-01-09: 3 [IU] via SUBCUTANEOUS
  Administered 2020-01-10: 2 [IU] via SUBCUTANEOUS
  Administered 2020-01-10: 3 [IU] via SUBCUTANEOUS
  Administered 2020-01-10: 5 [IU] via SUBCUTANEOUS
  Administered 2020-01-11 (×2): 2 [IU] via SUBCUTANEOUS

## 2020-01-01 MED ORDER — SODIUM CHLORIDE 0.9 % IV SOLN: INTRAVENOUS | Status: DC

## 2020-01-01 NOTE — Progress Notes (Signed)
Family Medicine Teaching Service Daily Progress Note Intern Pager: [phone]  Patient name: Guy Gonzalez Medical record number: [id number] Date of birth: 08/24/1970 Age: 49 y.o. Gender: male  Primary Care Provider: Patient, No Pcp Per Consultants: None Code Status: Full code  Pt Overview and Major Events to Date:  12/31/2019 - admitted for DKA, shortness of breath due to Covid-19  Assessment and Plan: Guy Gonzalez is a 49 y.o. male presenting  with COVID-19 infection and hyperglycemia in setting of DKA. PMH is significant for T2 DM, GERD, obesity.  DKA, resolved CBG at presentation 561, currently 147. AG 30 on admission, now 11x2. Believe acute COVID-19 infection likely triggered onset of DKA. At home patient takes Metformin 1000 mg twice daily.  HbA1c today 14.6%. -Transition off of Endo tool (current CBG 147, gap closed x2, BHBA 1), can start IV D5-LR at 125/hr, carb modified diet -BMP and BHAB BID today 8/29, then daily 8/30  -continue frequent CBGs (q4) -Linagliptin due to current COVID-19 infection, A1c greater than 7.5%  COVID-19 positive, unvaccinated, overall improving, stable Doing well, satting 96% on RA. Patient unvaccinated for COVID-19, was.diagnosed w/ COVID 8/20.  D-dimer very elevated at 16.17, pro-Cal 0.3. CXR on presentation not significant for pulmonary infiltrate/pneumonia.   -No decadron, remdesivir d/t no O2 requirement and very elevated blood sugar -Linagliptin 5mg  daily due to A1c >7.5% -f/u Bcx x2 -Continuous pulse ox -f/u repeat D-dimer  GERD Was having reflux, frequent belching on admission -encourage patient to sit patient upright in bed -Protonix 40mg  PO daily  Chest pain, resolved TRP 12>8.  EKG at admission sinus tachycardia.  Chest pain is likely 2/2 increased demand on the heart from DKA, could also consider severe GERD symptoms and cause of described pain. -monitor for recurrent chest pain  -patient on protonix for GERD symptoms    FEN/GI: Carb modified diet Prophylaxis: Heparin  Disposition: Progressive  Subjective:  Evaluated resting in bed, has hoarse voice possibly due to cough, significant dehydration from DKA. Is breathing comfortably on room air. Very pleasant patient.  Objective: Temp:  [97.3 F (36.3 C)-98.2 F (36.8 C)] 97.7 F (36.5 C) (08/29 0801) Pulse Rate:  [108-138] 114 (08/29 0801) Resp:  [15-29] 20 (08/29 0801) BP: (105-150)/(68-103) 116/84 (08/29 0801) SpO2:  [94 %-100 %] 97 % (08/29 0801) Weight:  [114.5 kg-127 kg] 114.5 kg (08/28 2041) Physical Exam: General: No apparent distress, nontoxic-appearing, occasional belching/hiccups after consuming liquids Cardiovascular: Slightly tachycardic at 110, otherwise regular rhythm, no murmurs appreciated Respiratory: Crackles appreciated to bilateral lungs, base worse than upper lobes. Satting 96% on room air Abdomen: Normal bowel sounds appreciated, no masses appreciated Extremities: No lower extremity swelling, cyanosis, or deformity appreciated  Laboratory: Recent Labs  Lab 12/31/19 0724 12/31/19 0724 12/31/19 1127 12/31/19 1148 12/31/19 2133  WBC 14.2*  --  19.5*  --  15.7*  HGB 17.6*   < > 17.1* 18.0* 17.4*  HCT 55.2*   < > 52.7* 53.0* 51.8  PLT 437*  --  414*  --  399   < > = values in this interval not displayed.   Recent Labs  Lab 12/31/19 1127 12/31/19 1148 12/31/19 2133 01/01/20 0219 01/01/20 0643  NA  --    < > 138 138 138  K  --    < > 4.4 4.3 4.2  CL  --   --  105 105 105  CO2  --   --  13* 19* 22  BUN  --   --  24* 20 19  CREATININE  --   --  1.42* 1.18 1.12  CALCIUM  --   --  10.1 9.7 9.9  PROT 7.6  --   --   --   --   BILITOT 2.2*  --   --   --   --   ALKPHOS 79  --   --   --   --   ALT 29  --   --   --   --   AST 24  --   --   --   --   GLUCOSE  --   --  208* 207* 151*   < > = values in this interval not displayed.   MRSA PCR negative Troponins 12>8  HIV antibody negative BHAB: >8 > 1.05  (8/29) Follow-up blood culture, urine culture Urinalysis: Straw-colored greater than 500 glucose, moderate hemoglobin, 80 ketones, 100 protein CRP 5 D-Dimer 16   Imaging/Diagnostic Tests: DG Chest 2 View  Result Date: 12/16/2019 CLINICAL DATA:  Chest congestion for two weeks, no fever, rash over right eye, no N/V/D. EXAM: CHEST - 2 VIEW COMPARISON:  Chest radiograph 06/13/2018 FINDINGS: The heart size and mediastinal contours are within normal limits. The lungs are clear. No pneumothorax or pleural effusion. The visualized skeletal structures are unremarkable. IMPRESSION: No acute cardiopulmonary process. Electronically Signed   By: Emmaline Kluver M.D.   On: 12/16/2019 15:37   DG Chest Portable 1 View  Result Date: 12/31/2019 CLINICAL DATA:  Shortness of breath. EXAM: PORTABLE CHEST 1 VIEW COMPARISON:  December 16, 2019 FINDINGS: A linear metallic structure overlies the lower right neck, extending to midline. This is probably something outside the patient. The heart, hila, and mediastinum are normal. No pneumothorax. No nodules or masses. No focal infiltrates. IMPRESSION: 1. A linear metallic foreign body overlies the lower right neck and upper chest, likely outside the patient. Recommend clinical correlation. 2. No pulmonary infiltrate/pneumonia identified. No other abnormalities. Electronically Signed   By: Gerome Sam III M.D   On: 12/31/2019 08:25     Dollene Cleveland, DO 01/01/2020, 8:54 AM PGY-3, [place] [facility] FPTS Intern pager: [phone], text pages welcome

## 2020-01-01 NOTE — Progress Notes (Signed)
Spoke with resident to inform her of CBG of 416. She will order a dose of 12 units of Novolog. Ok to not have lab come draw glucose level.

## 2020-01-01 NOTE — Progress Notes (Signed)
MEWs score has been yellow

## 2020-01-01 NOTE — Progress Notes (Deleted)
Pharmacy COVID-19 Monoclonal Antibody Screening  Guy Gonzalez was identified as being not hospitalized with symptoms from Covid-19 on admission but an incidental positive PCR has been documented.  The patient may qualify for the use of monoclonal antibodies (mAB) for COVID-19 viral infection to prevent worsening symptoms stemming from Covid-19 infection.  The patient was identified based on a positive COVID-19 PCR and not requiring the use of supplemental oxygen at this time.  This patient meets the FDA criteria for Emergency Use Authorization of casirivimab/imdevimab.  Has a (+) direct SARS-CoV-2 viral test result  Is NOT hospitalized due to COVID-19  Is within 10 days of symptom onset  Has at least one of the high risk factor(s) for progression to severe COVID-19 and/or hospitalization as defined in EUA.  Specific high risk criteria : BMI > 25 and Diabetes  Additionally: The patient has had a positive COVID-19 PCR in the last 90 days.  The patient is unvaccinated against COVID-19.  Since the patient is unvaccinated and meets high risk criteria, the patient is eligible for mAB administration.   This eligibility and indication for treatment was discussed with the patient's physician: Dr. Shanon Rosser  Plan: Based on the above discussion, it was decided that the patient will receive one dose of mAB combination.Casirivimab/imdevimab has been ordered. Pharmacy will coordinate administration timing with patient's nurse. Recommended infusion monitoring parameters communicated to the nursing team.   De Burrs  PGY1 Pharmacy Resident 01/01/2020  12:55 PM

## 2020-01-01 NOTE — Progress Notes (Signed)
MEWs score has been yellow  

## 2020-01-01 NOTE — Progress Notes (Signed)
Received page from [employer], concerned about patient's CBG of 416. Patient is currently on sensitive SSI, up to 400 sliding scale indicates 9 units of Novolog. Patient has significant insulin resistance.   -Will order Novolog 12 units for now.  -Follow up next CBG -sSSI changed to moderate Sliding Scale.   Thank you, Sam!  Peggyann Shoals, DO [facility], PGY-3 01/01/2020 5:57 PM

## 2020-01-02 ENCOUNTER — Inpatient Hospital Stay (HOSPITAL_COMMUNITY): Payer: 59

## 2020-01-02 DIAGNOSIS — R490 Dysphonia: Secondary | ICD-10-CM

## 2020-01-02 DIAGNOSIS — K219 Gastro-esophageal reflux disease without esophagitis: Secondary | ICD-10-CM | POA: Diagnosis present

## 2020-01-02 LAB — GLUCOSE, CAPILLARY
Glucose-Capillary: 215 mg/dL — ABNORMAL HIGH (ref 70–99)
Glucose-Capillary: 247 mg/dL — ABNORMAL HIGH (ref 70–99)
Glucose-Capillary: 256 mg/dL — ABNORMAL HIGH (ref 70–99)
Glucose-Capillary: 318 mg/dL — ABNORMAL HIGH (ref 70–99)
Glucose-Capillary: 343 mg/dL — ABNORMAL HIGH (ref 70–99)
Glucose-Capillary: 369 mg/dL — ABNORMAL HIGH (ref 70–99)

## 2020-01-02 LAB — BASIC METABOLIC PANEL
Anion gap: 9 (ref 5–15)
BUN: 12 mg/dL (ref 6–20)
CO2: 24 mmol/L (ref 22–32)
Calcium: 9.6 mg/dL (ref 8.9–10.3)
Chloride: 104 mmol/L (ref 98–111)
Creatinine, Ser: 0.96 mg/dL (ref 0.61–1.24)
GFR calc Af Amer: >60 mL/min (ref >60)
GFR calc non Af Amer: >60 mL/min (ref >60)
Glucose, Bld: 237 mg/dL — ABNORMAL HIGH (ref 70–99)
Potassium: 3.9 mmol/L (ref 3.5–5.1)
Sodium: 137 mmol/L (ref 135–145)

## 2020-01-02 LAB — CBC
HCT: 43 % (ref 39.0–52.0)
Hemoglobin: 14.7 g/dL (ref 13.0–17.0)
MCH: 30.9 pg (ref 26.0–34.0)
MCHC: 34.2 g/dL (ref 30.0–36.0)
MCV: 90.3 fL (ref 80.0–100.0)
Platelets: 358 10*3/uL (ref 150–400)
RBC: 4.76 MIL/uL (ref 4.22–5.81)
RDW: 12.5 % (ref 11.5–15.5)
WBC: 10.3 10*3/uL (ref 4.0–10.5)
nRBC: 0 % (ref 0.0–0.2)

## 2020-01-02 LAB — LIPASE, BLOOD: Lipase: 26 U/L (ref 11–51)

## 2020-01-02 LAB — BETA-HYDROXYBUTYRIC ACID: Beta-Hydroxybutyric Acid: 0.26 mmol/L (ref 0.05–0.27)

## 2020-01-02 MED ORDER — FAMOTIDINE 20 MG PO TABS
20.0000 mg | ORAL_TABLET | Freq: Every day | ORAL | Status: DC
  Administered 2020-01-02 – 2020-01-05 (×4): 20 mg via ORAL
  Filled 2020-01-02 (×4): qty 1

## 2020-01-02 MED ORDER — LIVING WELL WITH DIABETES BOOK
Freq: Once | Status: AC
  Filled 2020-01-02: qty 1

## 2020-01-02 MED ORDER — IOHEXOL 300 MG/ML  SOLN
100.0000 mL | Freq: Once | INTRAMUSCULAR | Status: AC | PRN
  Administered 2020-01-02: 100 mL via INTRAVENOUS

## 2020-01-02 MED ORDER — INSULIN STARTER KIT- PEN NEEDLES (ENGLISH)
1.0000 | Freq: Once | Status: AC
  Administered 2020-01-02: 1
  Filled 2020-01-02: qty 1

## 2020-01-02 NOTE — TOC Initial Note (Signed)
Transition of Care [facility] - Initial/Assessment Note    Patient Details  Name: Guy Gonzalez MRN: [id number] Date of Birth: May 05, 1971  Transition of Care [facility] CM/SW Contact:    Lockie Pares, RN Phone Number: 01/02/2020, 4:10 PM  Clinical Narrative:                 Patient in with COVID pneumonia day 2 patient very tired, only getting out of the bed when plied.  Not requiring o2 at this time. Unable to have 24/7 supervision at home, PT evaluation done, recommending supervision due to weakness, and recommending rolling walker. Walker obtained from [employer], unable to get patient by  Phone, did speak to patient wife. She is very concerned about being able to care for patient. She is not able to be there, she works and she cannot lift him by herself. She believes he needs rehab until he gets stronger. This CM encouraged her to speak to him regarding her feelings, as they must be synonymous in their decision.   Expected Discharge Plan: Home w Home Health Services Barriers to Discharge: Continued Medical Work up   Patient Goals and CMS Choice     Choice offered to / list presented to : Patient  Expected Discharge Plan and Services Expected Discharge Plan: Home w Home Health Services May need SNF placement   Discharge Planning Services: CM Consult Post Acute Care Choice: Home Health Living arrangements for the past 2 months: Single Family Home                 DME Arranged: Walker rolling DME Agency: [employer] Date DME Agency Contacted: 01/02/20 Time DME Agency Contacted: 1606 Representative spoke with at DME Agency: Velna Hatchet HH Arranged: PT, OT HH Agency: [facility] Date [facility] Agency Contacted: 01/02/20 Time HH Agency Contacted: 1607 Representative spoke with at [facility] Agency: Lorenza Chick  Prior Living Arrangements/Services Living arrangements for the past 2 months: Single Family Home   Patient language and need for interpreter reviewed:: Yes        Need for Family  Participation in Patient Care: Yes (Comment) Care giver support system in place?: Yes (comment)   Criminal Activity/Legal Involvement Pertinent to Current Situation/Hospitalization: No - Comment as needed  Activities of Daily Living Home Assistive Devices/Equipment: None ADL Screening (condition at time of admission) Patient's cognitive ability adequate to safely complete daily activities?: Yes Is the patient deaf or have difficulty hearing?: No Does the patient have difficulty seeing, even when wearing glasses/contacts?: No Does the patient have difficulty concentrating, remembering, or making decisions?: No Patient able to express need for assistance with ADLs?: Yes Does the patient have difficulty dressing or bathing?: Yes Independently performs ADLs?: No Communication: Independent Dressing (OT): Needs assistance Is this a change from baseline?: Change from baseline, expected to last <3days Grooming: Independent Feeding: Independent Bathing: Dependent Is this a change from baseline?: Change from baseline, expected to last <3 days Toileting: Needs assistance Is this a change from baseline?: Change from baseline, expected to last <3 days In/Out Bed: Needs assistance Is this a change from baseline?: Change from baseline, expected to last <3 days Walks in Home: Needs assistance Is this a change from baseline?: Change from baseline, expected to last <3 days Does the patient have difficulty walking or climbing stairs?: Yes Weakness of Legs: Both Weakness of Arms/Hands: Both  Permission Sought/Granted                  Emotional Assessment  Orientation: : Oriented to Place, Oriented to  Time, Oriented to Self, Oriented to Situation Alcohol / Substance Use: Not Applicable Psych Involvement: No (comment)  Admission diagnosis:  DKA (diabetic ketoacidoses) (HCC) [E11.10] Diabetic ketoacidosis without coma associated with type 2 diabetes mellitus (HCC) [E11.10] COVID-19  [U07.1] Patient Active Problem List   Diagnosis Date Noted  . Dysphonia   . Gastroesophageal reflux disease without esophagitis   . COVID-19   . DKA (diabetic ketoacidoses) (HCC) 12/31/2019  . HYPERGLYCEMIA 11/13/2008  . OBESITY 04/06/2008  . Allergic rhinitis, cause unspecified 04/06/2008  . ELEVATED BLOOD PRESSURE WITHOUT DIAGNOSIS OF HYPERTENSION 04/06/2008   PCP:  Patient, No Pcp Per Pharmacy:   [id number] - [place], [place] - [address] [facility] [address]  [place] [address] Phone: [phone] Fax: [phone]     Social Determinants of Health (SDOH) Interventions    Readmission Risk Interventions No flowsheet data found.

## 2020-01-02 NOTE — Evaluation (Signed)
Occupational Therapy Evaluation Patient Details Name: Guy Gonzalez MRN: [id number] DOB: 03/07/1971 Today's Date: 01/02/2020    History of Present Illness Pt adm with Covid and DKA. PMH - obesity, DM, GERD.    Clinical Impression   This 49 y/o male presents with the above. PTA pt living at home with family, reports was independent with ADL, iADL and functional mobility. Pt currently presenting with notable weakness, deconditioning and impaired cognition impacting his functional performance. Pt requiring minA for functional transfers without AD, was only able to tolerate sit<>stand (x2) and stand pivot transfer as he reports feeling too weak to attempt mobilizing further. Pt also endorsing abdominal discomfort, MD in start of session and reports plans for CT sometime today. Pt with great motivation to work and progress with therapies and am hopeful he will reach the point where he can return home. Given pt's current weakness and unsteadiness with mobility do not feel he can safely return home unless he has hands on assist. He will benefit from continued acute OT services and currently recommend post acute rehab services to progress pt towards his PLOF.   Pt on RA during session with SpO2 >/=92%, HR up to the 110s with activity.     Follow Up Recommendations  SNF;Supervision/Assistance - 24 hour (pending progress - hopeful can progress to [facility])    Equipment Recommendations  Tub/shower seat           Precautions / Restrictions Precautions Precautions: Fall Restrictions Weight Bearing Restrictions: No      Mobility Bed Mobility Overal bed mobility: Needs Assistance Bed Mobility: Sit to Supine       Sit to supine: Min guard   General bed mobility comments: Incr time and effort but no physical assist  Transfers Overall transfer level: Needs assistance Equipment used: None Transfers: Sit to/from Stand Sit to Stand: Min assist         General transfer comment: use of  momentum and increased time/effort initially requiring x2 attempts to stand from recliner, assist to bring hips up; performed additional sit<>stand from EOB -too fatigued to practice multiple repetitions     Balance Overall balance assessment: Needs assistance Sitting-balance support: No upper extremity supported;Feet supported Sitting balance-Leahy Scale: Fair     Standing balance support: Single extremity supported Standing balance-Leahy Scale: Poor Standing balance comment: UE support and min guard for static standing                           ADL either performed or assessed with clinical judgement   ADL Overall ADL's : Needs assistance/impaired Eating/Feeding: Set up;Sitting Eating/Feeding Details (indicate cue type and reason): to open drink containers Grooming: Sitting;Min guard   Upper Body Bathing: Min guard;Sitting   Lower Body Bathing: Maximal assistance;Sit to/from stand   Upper Body Dressing : Min guard;Sitting   Lower Body Dressing: Maximal assistance;Sit to/from stand Lower Body Dressing Details (indicate cue type and reason): assist to doff shoes end of session, minA for static standing  balance  Toilet Transfer: Minimal assistance;Stand-pivot Toilet Transfer Details (indicate cue type and reason): simulated recliner to bed, pt reports feeling too weak to attempt further mobility  Toileting- Clothing Manipulation and Hygiene: Maximal assistance;Sit to/from stand;Sitting/lateral lean       Functional mobility during ADLs: Minimal assistance General ADL Comments: pt with weakness and deconditioning  Pertinent Vitals/Pain Pain Assessment: Faces Faces Pain Scale: Hurts little more Pain Location: abdominal discomfort  Pain Descriptors / Indicators: Discomfort;Guarding;Cramping Pain Intervention(s): Monitored during session;Repositioned     Hand Dominance     Extremity/Trunk Assessment Upper Extremity  Assessment Upper Extremity Assessment: Generalized weakness   Lower Extremity Assessment Lower Extremity Assessment: Defer to PT evaluation   Cervical / Trunk Assessment Cervical / Trunk Assessment: Kyphotic   Communication Communication Communication: No difficulties   Cognition Arousal/Alertness: Awake/alert Behavior During Therapy: Flat affect Overall Cognitive Status: Impaired/Different from baseline Area of Impairment: Problem solving;Following commands                       Following Commands: Follows one step commands with increased time     Problem Solving: Slow processing;Decreased initiation     General Comments  HR up to 110s with activity. SpO2 >92% on RA    Exercises     Shoulder Instructions      Home Living Family/patient expects to be discharged to:: Private residence Living Arrangements: Spouse/significant other;Children Available Help at Discharge: Family;Available PRN/intermittently (wife works) Type of Home: House Home Access: Level entry     Home Layout: Two level;Bed/bath upstairs Alternate Level Stairs-Number of Steps: flight Alternate Level Stairs-Rails: Right Bathroom Shower/Tub: Chief Strategy Officer: Standard     Home Equipment: None          Prior Functioning/Environment Level of Independence: Independent        Comments: works for [employer]         OT Problem List: Decreased strength;Decreased range of motion;Decreased activity tolerance;Impaired balance (sitting and/or standing);Decreased cognition;Decreased safety awareness;Decreased knowledge of use of DME or AE;Cardiopulmonary status limiting activity;Decreased knowledge of precautions;Pain      OT Treatment/Interventions: Self-care/ADL training;Therapeutic exercise;Energy conservation;DME and/or AE instruction;Therapeutic activities;Patient/family education;Balance training;Cognitive remediation/compensation    OT Goals(Current  goals can be found in the care plan section) Acute Rehab OT Goals Patient Stated Goal: return home; regain his strength OT Goal Formulation: With patient Time For Goal Achievement: 01/16/20 Potential to Achieve Goals: Good  OT Frequency: Min 2X/week   Barriers to D/C:            Co-evaluation              AM-PAC OT "6 Clicks" Daily Activity     Outcome Measure Help from another person eating meals?: A Little Help from another person taking care of personal grooming?: A Little Help from another person toileting, which includes using toliet, bedpan, or urinal?: A Lot Help from another person bathing (including washing, rinsing, drying)?: A Lot Help from another person to put on and taking off regular upper body clothing?: A Little Help from another person to put on and taking off regular lower body clothing?: A Lot 6 Click Score: 15   End of Session Equipment Utilized During Treatment: Gait belt Nurse Communication: Mobility status  Activity Tolerance: Patient tolerated treatment well;Patient limited by lethargy Patient left: in bed;with call bell/phone within reach;with bed alarm set  OT Visit Diagnosis: Unsteadiness on feet (R26.81);Muscle weakness (generalized) (M62.81);Other symptoms and signs involving cognitive function                Time: 1413-1445 OT Time Calculation (min): 32 min Charges:  OT General Charges $OT Visit: 1 Visit OT Evaluation $OT Eval Moderate Complexity: 1 Mod OT Treatments $Self Care/Home Management : 8-22 mins  Marcy Siren, OT Acute Rehabilitation Services Pager [phone] Office  [phone]   Guy Gonzalez 01/02/2020, 3:23 PM

## 2020-01-02 NOTE — Progress Notes (Signed)
Family Medicine Teaching Service Daily Progress Note Intern Pager: [phone]  Patient name: Guy Gonzalez Medical record number: [id number] Date of birth: 1971-01-06 Age: 49 y.o. Gender: male  Primary Care Provider: Patient, No Pcp Per Consultants: None Code Status: Full code  Pt Overview and Major Events to Date:  12/31/2019 - admitted for DKA, shortness of breath due to Covid-19  Assessment and Plan: Lateef L Fulleris a 49 y.o.malepresenting with COVID-19 infectionand hyperglycemia in setting of DKA. PMH is significant forT2 DM, GERD, obesity.  DKA, resolved CBG at presentation 561, currently 215. Was in the 400s overnight received additional 15 U of SAI. Received total 27 U SAI.Believe acute COVID-19 infection likely triggered onset of DKA. BHB 0.26. At home patient takes Metformin 1000 mg twice daily.  HbA1c at admission 14.6%. Na 137 K+ 3.9 this AM. Transition to Med-surg. -Discontinue IV D5-LR at 125/hr - Continue 120ml/hr NS -BMP and BHABBID daily 8/30  -continue frequent CBGs (q4) - moderate sliding scale - Increased Lantus to 40U from 20 U -Linagliptin due to current COVID-19 infection, A1c greater than 7.5% - Diabetes coordinator  COVID-19 positive, unvaccinated, overall improving, stable Doing well, satting 96% on RA. Patient unvaccinatedfor COVID-19, was.diagnosed w/ COVID 8/20.  CXR on presentation not significant for pulmonary infiltrate/pneumonia. CBC WNL. Ddimer 16.17>5.86. -No decadron, remdesivir d/t no O2 requirement and very elevated blood sugar -Linagliptin 5mg  daily due to A1c >7.5% -f/u Bcx x2 ngtd -Continuous pulse ox -f/u repeat D-dimer  GERD Was having reflux, frequent belching on admission. Appears to have improved since admission; however patient continues to report pain with PO intake located at the xyphiod process. Patient received Sucralfate yesterday but continues to have reflux and reflux pain. Lipase WNL.  Concern for Gastric  ulcer. -encourage patient to sit patient upright in bed -Protonix 40mg  PO daily - Famotidine - CT Abdomen Pelvis w wo contrast - Continue surcralfate   Chest pain, resolved TRP12>8.EKG at admission sinus tachycardia. Chest pain is likely 2/2 increased demand on the heart from DKA, could also consider severe GERD symptoms and cause of described pain. Patient no longer reporting chest pain. -monitor for recurrent chest pain  -patient on protonix for GERD symptomsadding famotidine  FEN/GI: Carb modified diet Prophylaxis:Heparin  Disposition: Med-surg    Subjective:  Patient overnight CBG max 437, night team gave patient additional 15 U and increased patient 20 U LAI to 40. Patient continues to report chest pain beneath the xyphoid process that is worsened with drinking and eating. Patient does not report SOB.   Objective: Temp:  [97.6 F (36.4 C)-98.6 F (37 C)] 97.9 F (36.6 C) (08/30 0411) Pulse Rate:  [103-118] 107 (08/30 0411) Resp:  [20-26] 20 (08/30 0411) BP: (114-131)/(82-99) 121/91 (08/30 0411) SpO2:  [96 %-98 %] 96 % (08/30 0411) Weight:  10-25-1998 kg] 122 kg (08/29 1400) Physical Exam: General: NAD, appropriate mood and affect, appears stated age Cardiovascular: Tachycardic, no murmur detected Respiratory: Apical and lateral lobes clear bilaterally (patient on bedpan) Good chest expansion Abdomen: Pain to palpation of the epigastric area/ subxiphoid process Extremities: Distal pulses intact no edema noted  Laboratory: Recent Labs  Lab 12/31/19 1127 12/31/19 1127 12/31/19 1148 12/31/19 2133 01/02/20 0343  WBC 19.5*  --   --  15.7* 10.3  HGB 17.1*   < > 18.0* 17.4* 14.7  HCT 52.7*   < > 53.0* 51.8 43.0  PLT 414*  --   --  399 358   < > = values in this interval not  displayed.   Recent Labs  Lab 12/31/19 1127 12/31/19 1148 01/01/20 0643 01/01/20 1647 01/02/20 0343  NA  --    < > 138 133* 137  K  --    < > 4.2 4.5 3.9  CL  --    < > 105 103 104   CO2  --    < > 22 19* 24  BUN  --    < > 19 16 12   CREATININE  --    < > 1.12 1.23 0.96  CALCIUM  --    < > 9.9 9.3 9.6  PROT 7.6  --   --  6.2*  --   BILITOT 2.2*  --   --  1.1  --   ALKPHOS 79  --   --  58  --   ALT 29  --   --  21  --   AST 24  --   --  19  --   GLUCOSE  --    < > 151* 407* 237*   < > = values in this interval not displayed.      Imaging/Diagnostic Tests: No results found. None new.  , MD 01/02/2020, 7:05 AM PGY-1, [facility] Family Medicine FPTS Intern pager: [phone], text pages welcome

## 2020-01-02 NOTE — Evaluation (Signed)
Physical Therapy Evaluation Patient Details Name: Guy Gonzalez MRN: [id number] DOB: 11-14-1970 Today's Date: 01/02/2020   History of Present Illness  Pt adm with Covid and DKA. PMH - obesity, DM, GERD.   Clinical Impression  Pt presents to PT with significant decr in mobility, balance and activity tolerance. Pt had not been OOB over the past 2 days and is very weak. Lives with wife and son but no one will be home with the patient during the day per pt. Currently cannot mobilize on his own. Hopefully with increased activity he will rebound quickly and be able to return home. If he isn't significantly improved by tomorrow may need to look at SNF option.     Follow Up Recommendations Home health PT;Supervision/Assistance - 24 hour    Equipment Recommendations  Rolling walker with 5" wheels    Recommendations for Other Services       Precautions / Restrictions Precautions Precautions: Fall      Mobility  Bed Mobility Overal bed mobility: Needs Assistance Bed Mobility: Supine to Sit     Supine to sit: Supervision;HOB elevated     General bed mobility comments: Incr time and effort but no physical assist  Transfers Overall transfer level: Needs assistance Equipment used: None Transfers: Sit to/from Stand Sit to Stand: Min assist         General transfer comment: Assist to bring hips up  Ambulation/Gait Ambulation/Gait assistance: Min assist Gait Distance (Feet): 15 Feet (15' x 1, 12' x 1) Assistive device: None;Rolling walker (2 wheeled) Gait Pattern/deviations: Step-through pattern;Decreased step length - right;Decreased step length - left;Shuffle;Wide base of support;Trunk flexed Gait velocity: decr Gait velocity interpretation: <1.31 ft/sec, indicative of household ambulator General Gait Details: Assist for balance and support. Amb to bathroom without device and from bathroom to recliner with walker. Not a significant improvement with the walker and needed frequent  cues to stay closer to the walker.  Stairs            Wheelchair Mobility    Modified Rankin (Stroke Patients Only)       Balance Overall balance assessment: Needs assistance Sitting-balance support: No upper extremity supported;Feet supported Sitting balance-Leahy Scale: Fair     Standing balance support: Single extremity supported Standing balance-Leahy Scale: Poor Standing balance comment: UE support and min guard for static standing                             Pertinent Vitals/Pain Pain Assessment: No/denies pain    Home Living Family/patient expects to be discharged to:: Private residence Living Arrangements: Spouse/significant other;Children Available Help at Discharge: Family;Available PRN/intermittently (wife works) Type of Home: House Home Access: Level entry     Home Layout: Two level;Bed/bath upstairs Home Equipment: None      Prior Function Level of Independence: Independent               Hand Dominance        Extremity/Trunk Assessment        Lower Extremity Assessment Lower Extremity Assessment: Generalized weakness       Communication   Communication: No difficulties  Cognition Arousal/Alertness: Awake/alert Behavior During Therapy: Flat affect Overall Cognitive Status: Impaired/Different from baseline Area of Impairment: Problem solving;Following commands                       Following Commands: Follows one step commands with increased time     Problem  Solving: Slow processing;Decreased initiation        General Comments General comments (skin integrity, edema, etc.): HR 110's at rest and 120's with amb. SpO2 > 93% on RA    Exercises     Assessment/Plan    PT Assessment Patient needs continued PT services  PT Problem List Decreased strength;Decreased activity tolerance;Decreased balance;Decreased mobility;Obesity       PT Treatment Interventions      PT Goals (Current goals can be  found in the Care Plan section)  Acute Rehab PT Goals Patient Stated Goal: return home PT Goal Formulation: With patient Time For Goal Achievement: 01/16/20 Potential to Achieve Goals: Fair    Frequency     Barriers to discharge Inaccessible home environment;Decreased caregiver support 2 level home with bed upstairs. Wife works and will be home alone    Co-evaluation               AM-PAC PT "6 Clicks" Mobility  Outcome Measure Help needed turning from your back to your side while in a flat bed without using bedrails?: None Help needed moving from lying on your back to sitting on the side of a flat bed without using bedrails?: A Little Help needed moving to and from a bed to a chair (including a wheelchair)?: A Little Help needed standing up from a chair using your arms (e.g., wheelchair or bedside chair)?: A Little Help needed to walk in hospital room?: A Little Help needed climbing 3-5 steps with a railing? : A Lot 6 Click Score: 18    End of Session   Activity Tolerance: Patient limited by fatigue Patient left: in chair;with call bell/phone within reach Nurse Communication: Mobility status PT Visit Diagnosis: Unsteadiness on feet (R26.81);Other abnormalities of gait and mobility (R26.89);Muscle weakness (generalized) (M62.81)    Time: 2595-6387 PT Time Calculation (min) (ACUTE ONLY): 36 min   Charges:   PT Evaluation $PT Eval Moderate Complexity: 1 Mod PT Treatments $Gait Training: 8-22 mins        [facility] PT Acute Rehabilitation Services Pager [phone] Office [phone]   Angelina Ok [facility] 01/02/2020, 11:53 AM

## 2020-01-02 NOTE — Progress Notes (Addendum)
Pt to drink two bottles of omipaque for the CT scan of abdomen/pelvis.  First bottle started at 1940. Pt. Finished second bottle at 2110.  Notified CT.

## 2020-01-02 NOTE — Progress Notes (Signed)
   01/01/20 2000  Vitals  Temp 98 F (36.7 C)  Temp Source Axillary  BP (!) 127/99  MAP (mmHg) 109  BP Location Right Arm  BP Method Automatic  Patient Position (if appropriate) Lying  Pulse Rate (!) 112  Pulse Rate Source Monitor  ECG Heart Rate (!) 112  Resp (!) 24

## 2020-01-02 NOTE — Progress Notes (Signed)
Inpatient Diabetes Program Recommendations  AACE/ADA: New Consensus Statement on Inpatient Glycemic Control (2015)  Target Ranges:  Prepandial:   less than 140 mg/dL      Peak postprandial:   less than 180 mg/dL (1-2 hours)      Critically ill patients:  140 - 180 mg/dL   Lab Results  Component Value Date   GLUCAP 343 (H) 01/02/2020   HGBA1C 14.6 (H) 12/31/2019    Review of Glycemic Control  Diabetes history: DM 2 Outpatient Diabetes medications: Metformin 1000 mg bid Current orders for Inpatient glycemic control:  Lantus 40 units Daily Novolog 0-15 units tid Tradjenta 5 mg Daily  Inpatient Diabetes Program Recommendations:    Spoke with pt over the phone regarding A1c level and glucose control at home.   Discussed need for insulin at time of d/c. Described how to operate insulin pen. Needs a PCP. Pt had been to urgent care to get prescribed metformin 2 weeks ago as a refill. Wasn't taking his DM seriously. Pt did not have a glucose meter at home will need one at time of d/c. Unsure for what pt insurance will cover. Pt reports having 2 insurances one covering similar to [facility] and the other one [employer].  Will email pt link to copay card for Lantus solostar insulin pen  - Glucose meter kit order # [id number] - Lantus solostar insulin pen order # [id number] -  Insulin pen needles order # [id number]   Thanks,  Tama Headings RN, MSN, BC-ADM Inpatient Diabetes Coordinator Team Pager [phone] (8a-5p)

## 2020-01-02 NOTE — Progress Notes (Signed)
Received phone call from central telemetry.  Patient had a 5 beat run of V-tach.  I have examined patient and he is denying any chest pain or discomfort at this time.  Will continue to monitor.  Attending provider has been notified.

## 2020-01-02 NOTE — Hospital Course (Addendum)
Guy Gonzalez is a 49 y.o. male presenting  with COVID-19 infection and hyperglycemia in setting of DKA. PMH is significant for T2 DM, GERD, obesity.  DKA, resolved Patient presented in DKA was placed on DKA protocol.  Patient also received lidocaine and Maalox in ED.  A/G gap closed x2.  BHB had downward trend to within normal limits.  Patient transition off of insulin gtt., potassium,  and IV D5 LR to mIVF NS and Lantus with sensitive sliding scale.  Patient appeared to have significant insulin resistance and was changed to a moderate sliding scale.  Lantus regimen started at 20 units, increased to 40 units, then 50U, then 30U BID.  Patient was also placed on linagliptin as his A1c was 14.6% and he was COVID positive.  Patient was visited by diabetes coordinator as he had never taken insulin in the past and required education prior to discharge.  COVID-19 positive, unvaccinated-improving, stable Patient did not have O2 requirement at presentation or during hospitalization. Patient was not placed on Decadron or remdesivir and he remained stable on room air throughout his admission.  GERD, Duodenitis Patient had significant acid reflux at presentation with belching.  Patient continued to have epigastric pain despite Sucralfate, Maalox/Mylanta, Protonix and Famotidine. CT abdomen pelvis resulted no significant process in the abdomen to explain pain. However it did find colonic diverticulosis and 1.3 right renal cyst. Called GI who recommended getting a RUQ [place] and if negative an Upper GI series, but GI did not feel it was necessary to see patient and felt that current medication regimen was appropriate. RUQ [place] was normal and upper GI series revealed duodenitis.   Chest pain At presentation patient was tachycardic and EKG noted increased demand on the heart. Patient's chest pain resolved with DKA resolution.

## 2020-01-03 ENCOUNTER — Inpatient Hospital Stay (HOSPITAL_COMMUNITY): Payer: 59

## 2020-01-03 LAB — BASIC METABOLIC PANEL
Anion gap: 8 (ref 5–15)
BUN: 7 mg/dL (ref 6–20)
CO2: 23 mmol/L (ref 22–32)
Calcium: 9 mg/dL (ref 8.9–10.3)
Chloride: 107 mmol/L (ref 98–111)
Creatinine, Ser: 0.76 mg/dL (ref 0.61–1.24)
GFR calc Af Amer: >60 mL/min (ref >60)
GFR calc non Af Amer: >60 mL/min (ref >60)
Glucose, Bld: 201 mg/dL — ABNORMAL HIGH (ref 70–99)
Potassium: 3.6 mmol/L (ref 3.5–5.1)
Sodium: 138 mmol/L (ref 135–145)

## 2020-01-03 LAB — CBC
HCT: 39.5 % (ref 39.0–52.0)
Hemoglobin: 13.2 g/dL (ref 13.0–17.0)
MCH: 30.3 pg (ref 26.0–34.0)
MCHC: 33.4 g/dL (ref 30.0–36.0)
MCV: 90.6 fL (ref 80.0–100.0)
Platelets: 272 10*3/uL (ref 150–400)
RBC: 4.36 MIL/uL (ref 4.22–5.81)
RDW: 12.6 % (ref 11.5–15.5)
WBC: 6.1 10*3/uL (ref 4.0–10.5)
nRBC: 0 % (ref 0.0–0.2)

## 2020-01-03 LAB — GLUCOSE, CAPILLARY
Glucose-Capillary: 185 mg/dL — ABNORMAL HIGH (ref 70–99)
Glucose-Capillary: 198 mg/dL — ABNORMAL HIGH (ref 70–99)
Glucose-Capillary: 200 mg/dL — ABNORMAL HIGH (ref 70–99)
Glucose-Capillary: 225 mg/dL — ABNORMAL HIGH (ref 70–99)
Glucose-Capillary: 281 mg/dL — ABNORMAL HIGH (ref 70–99)
Glucose-Capillary: 311 mg/dL — ABNORMAL HIGH (ref 70–99)

## 2020-01-03 MED ORDER — INSULIN GLARGINE 100 UNIT/ML ~~LOC~~ SOLN
50.0000 [IU] | Freq: Every day | SUBCUTANEOUS | Status: DC
  Administered 2020-01-04 – 2020-01-05 (×2): 50 [IU] via SUBCUTANEOUS
  Filled 2020-01-03 (×2): qty 0.5

## 2020-01-03 MED ORDER — ACETAMINOPHEN 325 MG PO TABS
650.0000 mg | ORAL_TABLET | ORAL | Status: DC
  Administered 2020-01-03 – 2020-01-06 (×15): 650 mg via ORAL
  Filled 2020-01-03 (×17): qty 2

## 2020-01-03 NOTE — Progress Notes (Signed)
Physical Therapy Treatment Patient Details Name: Guy Gonzalez MRN: [id number] DOB: 1970-12-31 Today's Date: 01/03/2020    History of Present Illness Pt is a 49 y.o. male admitted 12/31/19 with Covid and hyperglycemia in the setting of DKA. Abdominal/pelvic with mild to moderate severity patchy multifocal bilateral lobe infiltrates; colonic diverticulosis and a renal cyst. PMH includes obesity, DM, GERD.   PT Comments    Pt slowly progressing with mobility. Mobility and ADL tasks limited by c/o significant abdominal pain and rest and with any movement. Educ on abdominal precautions for comfort. Pt requires assist to stand and maintain balance with ambulation; at high risk for falls. Demonstrates poor insight into deficits. Recommend SNF-level therapies to maximize functional mobility and independence prior to return home.    Follow Up Recommendations  SNF;Supervision for mobility/OOB     Equipment Recommendations  Rolling walker with 5" wheels    Recommendations for Other Services       Precautions / Restrictions Precautions Precautions: Fall;Other (comment) Precaution Comments: Abdominal precautions for comfort Restrictions Weight Bearing Restrictions: No    Mobility  Bed Mobility Overal bed mobility: Needs Assistance Bed Mobility: Supine to Sit;Sit to Supine     Supine to sit: Supervision Sit to supine: Supervision   General bed mobility comments: Significant increased time and effort; educated on log roll technique for abdominal comfort  Transfers Overall transfer level: Needs assistance Equipment used: None Transfers: Sit to/from Stand Sit to Stand: Min assist;From elevated surface         General transfer comment: Reliant on momentum to power into standingw ith minA for HHA to maintain stability  Ambulation/Gait Ambulation/Gait assistance: Min assist Gait Distance (Feet): 22 Feet Assistive device: 1 person hand held assist Gait Pattern/deviations:  Step-through pattern;Decreased stride length;Wide base of support;Trunk flexed;Antalgic;Staggering right;Staggering left Gait velocity: Decreased   General Gait Details: Slow, very unsteady gait with HHA (pt declined use of RW) and consistent minA for stability; pt stumbling, states due to pain and BLE weakness. Recommend +2 safety   Stairs             Wheelchair Mobility    Modified Rankin (Stroke Patients Only)       Balance Overall balance assessment: Needs assistance Sitting-balance support: No upper extremity supported;Feet supported Sitting balance-Leahy Scale: Fair Sitting balance - Comments: Difficulty reaching feet to don shoes, reports due to stomach pain   Standing balance support: Single extremity supported Standing balance-Leahy Scale: Poor Standing balance comment: Reliant on UE support                            Cognition Arousal/Alertness: Awake/alert Behavior During Therapy: Flat affect Overall Cognitive Status: No family/caregiver present to determine baseline cognitive functioning Area of Impairment: Attention;Following commands;Safety/judgement;Awareness;Problem solving                   Current Attention Level: Selective   Following Commands: Follows one step commands with increased time Safety/Judgement: Decreased awareness of safety;Decreased awareness of deficits Awareness: Emergent Problem Solving: Slow processing;Decreased initiation General Comments: Internally distracted by pain      Exercises Other Exercises Other Exercises: Incetive spirometer x5 (pulled ~1750 mL) - reports he had never done yet    General Comments General comments (skin integrity, edema, etc.): SpO2 94% on RA, HR 97      Pertinent Vitals/Pain Pain Assessment: Faces Faces Pain Scale: Hurts whole lot Pain Location: Abdomen Pain Descriptors / Indicators: Discomfort;Guarding;Cramping;Grimacing;Moaning Pain Intervention(s): Limited  activity  within patient's tolerance;Monitored during session;Other (comment) (encouraged pt to actually ask for pain medications if he feels he needs them)    Home Living                      Prior Function            PT Goals (current goals can now be found in the care plan section) Progress towards PT goals: Progressing toward goals    Frequency    Min 2X/week      PT Plan Frequency needs to be updated;Discharge plan needs to be updated    Co-evaluation              AM-PAC PT "6 Clicks" Mobility   Outcome Measure  Help needed turning from your back to your side while in a flat bed without using bedrails?: None Help needed moving from lying on your back to sitting on the side of a flat bed without using bedrails?: None Help needed moving to and from a bed to a chair (including a wheelchair)?: A Little Help needed standing up from a chair using your arms (e.g., wheelchair or bedside chair)?: A Little Help needed to walk in hospital room?: A Little Help needed climbing 3-5 steps with a railing? : A Lot 6 Click Score: 19    End of Session   Activity Tolerance: Patient limited by pain Patient left: in bed;with call bell/phone within reach;with bed alarm set Nurse Communication: Mobility status PT Visit Diagnosis: Unsteadiness on feet (R26.81);Other abnormalities of gait and mobility (R26.89);Muscle weakness (generalized) (M62.81)     Time: 8295-6213 PT Time Calculation (min) (ACUTE ONLY): 21 min  Charges:  $Gait Training: 8-22 mins                    Ina Homes, PT, DPT Acute Rehabilitation Services  Pager [phone] Office [phone]  Malachy Chamber 01/03/2020, 5:36 PM

## 2020-01-03 NOTE — Progress Notes (Signed)
Inpatient Diabetes Program Recommendations  AACE/ADA: New Consensus Statement on Inpatient Glycemic Control (2015)  Target Ranges:  Prepandial:   less than 140 mg/dL      Peak postprandial:   less than 180 mg/dL (1-2 hours)      Critically ill patients:  140 - 180 mg/dL   Lab Results  Component Value Date   GLUCAP 198 (H) 01/03/2020   HGBA1C 14.6 (H) 12/31/2019    Review of Glycemic Control Results for Guy Gonzalez, Guy Gonzalez (MRN [id number]) as of 01/03/2020 11:44  Ref. Range 01/02/2020 07:52 01/02/2020 12:12 01/02/2020 16:23 01/02/2020 21:09 01/03/2020 00:26 01/03/2020 05:10 01/03/2020 07:58  Glucose-Capillary Latest Ref Range: 70 - 99 mg/dL 256 (H) 343 (H) 318 (H) 247 (H) 225 (H) 200 (H) 198 (H)   Diabetes history: DM 2 Outpatient Diabetes medications: Metformin 1000 mg bid Current orders for Inpatient glycemic control:  Lantus 40 units Daily Novolog 0-15 units tid Tradjenta 5 mg Daily  Inpatient Diabetes Program Recommendations:    -  Increase Lantus to 48 units   D/C planning:  Emailed pt link to copay card for Lantus solostar insulin pen on 8/30  - Glucose meter kit order # [id number] - Lantus solostar insulin pen order # [id number] -  Insulin pen needles order # [id number]   Thanks,  Tama Headings RN, MSN, BC-ADM Inpatient Diabetes Coordinator Team Pager [phone] (8a-5p)

## 2020-01-03 NOTE — TOC Progression Note (Signed)
Transition of Care [facility]) - Progression Note    Patient Details  Name: Guy Gonzalez MRN: [id number] Date of Birth: 1970/07/09  [facility] CM/SW Contact  Mearl Latin, LCSW Phone Number: 01/03/2020, 8:47 AM  Clinical Narrative:    CSW notes patient's spouse's request for SNF placement. Currently, [facility] not accept [employer]. Awaiting response from [place]. [facility] has never heard of that insurance, so will likely be out of network, but they will follow up. No other COVID accepting SNFs available.    Expected Discharge Plan: Home w Home Health Services Barriers to Discharge: Continued Medical Work up  Expected Discharge Plan and Services Expected Discharge Plan: Home w Home Health Services   Discharge Planning Services: CM Consult Post Acute Care Choice: Home Health Living arrangements for the past 2 months: Single Family Home                 DME Arranged: Walker rolling DME Agency: [employer] Date DME Agency Contacted: 01/02/20 Time DME Agency Contacted: [phone] Representative spoke with at DME Agency: Velna Hatchet HH Arranged: PT, OT [facility] Agency: [facility] Date [facility] Agency Contacted: 01/02/20 Time HH Agency Contacted: [phone] Representative spoke with at [facility] Agency: Lorenza Chick   Social Determinants of Health (SDOH) Interventions    Readmission Risk Interventions No flowsheet data found.

## 2020-01-03 NOTE — Progress Notes (Signed)
Family Medicine Teaching Service Daily Progress Note Intern Pager: [phone]  Patient name: Guy Gonzalez Medical record number: [id number] Date of birth: [birth year] Age: 49 y.o. Gender: male  Primary Care Provider: Patient, No Pcp Per Consultants: None Code Status: FULL  Pt Overview and Major Events to Date:  12/31/2019 - admittedfor DKA,shortness of breath due to Covid-19  Assessment and Plan: Guy L Fulleris a 49 y.o.malepresentingwith COVID-19 infectionand hyperglycemia in setting of DKA. PMH is significant forT2 DM, GERD, obesity.  DKA, resolved CBG at presentation 561, currently 198.  Na 138 K+ 3.6 this AM. Required 30 U SAI yesterday. Diabetes coordinator saw patient. -Discontinue IV D5-LR at 125/hr - discontinue 162ml/hr NS -BMP -continue frequent CBGs (q4) - moderate sliding scale - Increase Lantus to 40U  To 50U -Linagliptin due to current COVID-19 infection, A1c greater than 7.5% - Diabetes coordinator  COVID-19 positive, unvaccinated, overall improving, stable Doing well, satting 94% on RA.Patientunvaccinatedfor COVID-19, was.diagnosed w/ COVID 8/20.CXR on presentation not significant for pulmonary infiltrate/pneumonia.CBC WNL. -No decadron, remdesivir d/t no O2 requirementand very elevated blood sugar -Linagliptin 5mg  daily due to A1c >7.5% -f/u Bcx x2 ngtd -Continuous pulse ox   GERD Received CT Abdomen Pelvis revealed mild to moderate severity patchy multifocal bilateral lobe infiltrates. Colonic diverticulosis and a 1.3 cm renal cyst. We will continue to treat for GERD at this time. Patient also had first BM inpatient yesterday decreasing concern for SBO. -encourage patient to sit patient upright in bed -Protonix 40mg  PO daily - Famotidine - Continue surcralfate - Consult GI - tylenol scheduled q4  Chest pain, resolved  Patient no longer reporting chest pain. -monitor for recurrent chest pain  -patient on protonix for GERD  symptomsadding famotidine  FEN/GI:Clears diet Prophylaxis:Heparin  Disposition:Med-surg    Subjective:  No overnight events. Patient continues to have subxiphoid pain. Patient reports that although he has constant pain but the pain is worse while eating.  Objective: Temp:  [97.6 F (36.4 C)-98.2 F (36.8 C)] 98.1 F (36.7 C) (08/31 0750) Pulse Rate:  [86-101] 90 (08/31 0750) Resp:  [16-20] 19 (08/31 0750) BP: (123-139)/(80-100) 139/100 (08/31 0750) SpO2:  [96 %-98 %] 98 % (08/31 0750) Physical Exam: General: holding epigastric area and wincing in pain Cardiovascular: tachycardic with normal rhythm, no murmur detected Respiratory: clear bilaterally with apical sounds, not able to lean forward due to significant pain in epigastric area Abdomen: No pain in 4 quadrants, significant pain in the epigastric area Extremities: Distal pulses intact  Laboratory: Recent Labs  Lab 12/31/19 2133 01/02/20 0343 01/03/20 0227  WBC 15.7* 10.3 6.1  HGB 17.4* 14.7 13.2  HCT 51.8 43.0 39.5  PLT 399 358 272   Recent Labs  Lab 12/31/19 1127 12/31/19 1148 01/01/20 1647 01/02/20 0343 01/03/20 0227  NA  --    < > 133* 137 138  K  --    < > 4.5 3.9 3.6  CL  --    < > 103 104 107  CO2  --    < > 19* 24 23  BUN  --    < > 16 12 7   CREATININE  --    < > 1.23 0.96 0.76  CALCIUM  --    < > 9.3 9.6 9.0  PROT 7.6  --  6.2*  --   --   BILITOT 2.2*  --  1.1  --   --   ALKPHOS 79  --  58  --   --   ALT 29  --  21  --   --   AST 24  --  19  --   --   GLUCOSE  --    < > 407* 237* 201*   < > = values in this interval not displayed.     Imaging/Diagnostic Tests: CT ABDOMEN PELVIS W CONTRAST  Result Date: 01/02/2020 CLINICAL DATA:  Abdominal pain. EXAM: CT ABDOMEN AND PELVIS WITH CONTRAST TECHNIQUE: Multidetector CT imaging of the abdomen and pelvis was performed using the standard protocol following bolus administration of intravenous contrast. CONTRAST:  OMNIPAQUE IOHEXOL 300  MG/ML  SOLN COMPARISON:  None. FINDINGS: Lower chest: Mild to moderate severity patchy multifocal infiltrates are seen along the periphery of the bilateral lower lobes, right greater than left. Hepatobiliary: No focal liver abnormality is seen. No gallstones, gallbladder wall thickening, or biliary dilatation. Pancreas: Unremarkable. No pancreatic ductal dilatation or surrounding inflammatory changes. Spleen: Normal in size without focal abnormality. Adrenals/Urinary Tract: Adrenal glands are unremarkable. Kidneys are normal, without renal calculi or hydronephrosis. A 1.3 cm cyst is seen within the posteromedial aspect of the mid right kidney. Bladder is unremarkable. Stomach/Bowel: Stomach is within normal limits. Appendix appears normal. No evidence of bowel dilatation. Noninflamed diverticula are seen within the descending and sigmoid colon. Vascular/Lymphatic: No significant vascular findings are present. No enlarged abdominal or pelvic lymph nodes. Reproductive: Prostate is unremarkable. Other: No abdominal wall hernia or abnormality. No abdominopelvic ascites. Musculoskeletal: No acute or significant osseous findings. IMPRESSION: 1. Mild to moderate severity patchy multifocal bilateral lower lobe infiltrates. 2. Colonic diverticulosis. 3. 1.3 cm right renal cyst. Electronically Signed   By: Aram Candela M.D.   On: 01/02/2020 23:23    Bobbye Morton, MD 01/03/2020, 8:02 AM PGY-1, [facility] Family Medicine FPTS Intern pager: [phone], text pages welcome

## 2020-01-03 NOTE — Progress Notes (Signed)
Spoke to pt wife to update on plan for :CT scan.  Answered all questions; encouraged her to call with any other questions.

## 2020-01-04 ENCOUNTER — Inpatient Hospital Stay (HOSPITAL_COMMUNITY): Payer: 59

## 2020-01-04 ENCOUNTER — Encounter (HOSPITAL_COMMUNITY): Payer: Self-pay | Admitting: Family Medicine

## 2020-01-04 DIAGNOSIS — E1165 Type 2 diabetes mellitus with hyperglycemia: Secondary | ICD-10-CM | POA: Diagnosis present

## 2020-01-04 DIAGNOSIS — R1013 Epigastric pain: Secondary | ICD-10-CM

## 2020-01-04 DIAGNOSIS — R109 Unspecified abdominal pain: Secondary | ICD-10-CM | POA: Diagnosis present

## 2020-01-04 LAB — CBC
HCT: 39.5 % (ref 39.0–52.0)
Hemoglobin: 13.1 g/dL (ref 13.0–17.0)
MCH: 30.5 pg (ref 26.0–34.0)
MCHC: 33.2 g/dL (ref 30.0–36.0)
MCV: 92.1 fL (ref 80.0–100.0)
Platelets: 290 K/uL (ref 150–400)
RBC: 4.29 MIL/uL (ref 4.22–5.81)
RDW: 12.7 % (ref 11.5–15.5)
WBC: 5.7 K/uL (ref 4.0–10.5)
nRBC: 0 % (ref 0.0–0.2)

## 2020-01-04 LAB — BASIC METABOLIC PANEL WITH GFR
Anion gap: 9 (ref 5–15)
BUN: 5 mg/dL — ABNORMAL LOW (ref 6–20)
CO2: 23 mmol/L (ref 22–32)
Calcium: 8.7 mg/dL — ABNORMAL LOW (ref 8.9–10.3)
Chloride: 103 mmol/L (ref 98–111)
Creatinine, Ser: 0.92 mg/dL (ref 0.61–1.24)
GFR calc Af Amer: >60 mL/min
GFR calc non Af Amer: >60 mL/min
Glucose, Bld: 313 mg/dL — ABNORMAL HIGH (ref 70–99)
Potassium: 3.7 mmol/L (ref 3.5–5.1)
Sodium: 135 mmol/L (ref 135–145)

## 2020-01-04 LAB — GLUCOSE, CAPILLARY
Glucose-Capillary: 301 mg/dL — ABNORMAL HIGH (ref 70–99)
Glucose-Capillary: 202 mg/dL — ABNORMAL HIGH (ref 70–99)
Glucose-Capillary: 120 mg/dL — ABNORMAL HIGH (ref 70–99)
Glucose-Capillary: 221 mg/dL — ABNORMAL HIGH (ref 70–99)

## 2020-01-04 MED ORDER — POLYETHYLENE GLYCOL 3350 17 G PO PACK
17.0000 g | PACK | Freq: Every day | ORAL | Status: DC
  Administered 2020-01-05: 17 g via ORAL
  Filled 2020-01-04: qty 1

## 2020-01-04 NOTE — Progress Notes (Signed)
Family Medicine Teaching Service Daily Progress Note Intern Pager: [phone]  Patient name: Guy Gonzalez Medical record number: [id number] Date of birth: 21-Jul-1970 Age: 49 y.o. Gender: male  Primary Care Provider: Patient, No Pcp Per Consultants: GI curbside Code Status: FULL  Pt Overview and Major Events to Date:  8/28: admitted for DKA, SOB 2/2 COVID-19  Assessment and Plan: Guy Gonzalez is a 49 y/o M presenting with COVID-19 infection and DKA (resolved), now with ongoing epigastric pain. PMHx significant for T2DM, GERD, obesity.  Epigastric pain, GERD Minimal relief of epigastric pain with current regimen of Protonix, Pepcid, Carafate and Maalox/Mylanta. CT A/P demonstrated mild to moderate patchy multifocal infiltrates of bilateral lungs, colonic diverticulosis, and 1.3cm renal cyst. RUQ U/S normal. Continuing to treat as GERD for now. Consider constipation and gastroparesis as additional contributing etiologies. -upper GI series pending -Tylenol scheduled q4h -Protonix 40mg  PO daily -Pepcid 20mg  PO daily -Carafate 1g TID with meals and bedtime -Maalox/Mylanta q4h prn -Will start Miralax 17g daily  Uncontrolled T2DM, initially w/DKA, now resolved CBG currently 301, required 22u SAI yesterday. -50u Lantus daily. Consider additional increase or BID dosing -moderate sliding scale -q4h CBGs -daily BMP -Linagliptin 5mg  daily for COVID given A1C >7.5% -Diabetes education   COVID-19, stable on room air -Linagliptin 5mg  daily given A1C > 7.5% -Continuous pulse ox -Blood cx no growth x3 days   FEN/GI: NPO for upper GI series PPx: Lovenox 60mg  daily  Disposition: pending epigastric pain control. Home w/home health  Subjective:  Patient complains of ongoing epigastric pain, unchanged with eating. Reports mild temporary relief after Maalox/Mylanta cocktail. Also reports frequent hiccups and belching. Denies nausea, vomiting, chest pain or SOB. Endorses mild cough. Last BM 2  days ago.   Objective: Temp:  [97.5 F (36.4 C)-98.3 F (36.8 C)] 98.1 F (36.7 C) (08/31 2033) Pulse Rate:  [75-98] 85 (08/31 2026) Resp:  [17-20] 20 (08/31 2026) BP: (111-139)/(77-100) 127/92 (08/31 2033) SpO2:  [96 %-98 %] 98 % (08/31 2026) Physical Exam: General: alert, uncomfortable appearing Cardiovascular: RRR, normal S1/S2 without m/r/g Respiratory: normal WOB, lungs CTAB Abdomen: moderate epigastric tenderness to palpation, otherwise nontender, nondistended Extremities: 2+ distal pulses, no peripheral edema  Laboratory: Recent Labs  Lab 01/02/20 0343 01/03/20 0227 01/04/20 0140  WBC 10.3 6.1 5.7  HGB 14.7 13.2 13.1  HCT 43.0 39.5 39.5  PLT 358 272 290   Recent Labs  Lab 12/31/19 1127 12/31/19 1148 01/01/20 1647 01/01/20 1647 01/02/20 0343 01/03/20 0227 01/04/20 0140  NA  --    < > 133*   < > 137 138 135  K  --    < > 4.5   < > 3.9 3.6 3.7  CL  --    < > 103   < > 104 107 103  CO2  --    < > 19*   < > 24 23 23   BUN  --    < > 16   < > 12 7 5*  CREATININE  --    < > 1.23   < > 0.96 0.76 0.92  CALCIUM  --    < > 9.3   < > 9.6 9.0 8.7*  PROT 7.6  --  6.2*  --   --   --   --   BILITOT 2.2*  --  1.1  --   --   --   --   ALKPHOS 79  --  58  --   --   --   --  ALT 29  --  21  --   --   --   --   AST 24  --  19  --   --   --   --   GLUCOSE  --    < > 407*   < > 237* 201* 313*   < > = values in this interval not displayed.     Imaging/Diagnostic Tests:  US Abdomen Limited RUQ Result Date: 01/03/2020 IMPRESSION: Unremarkable right upper quadrant ultrasound.  9/1: Upper GI Series pending   Maury Dus, MD 01/04/2020, 6:39 AM PGY-1, [place] Family Medicine FPTS Intern pager: [phone], text pages welcome

## 2020-01-04 NOTE — TOC Progression Note (Signed)
Transition of Care [facility]) - Progression Note    Patient Details  Name: IRINEO GAULIN MRN: [id number] Date of Birth: May 24, 1970  [facility] CM/SW Contact  Lawerance Sabal, RN Phone Number: 01/04/2020, 8:27 AM  Clinical Narrative:    HH orders placed. Called patient to discuss plan. He states that he still wants to DC to a SNF. CSW in process of finding an in network SNF that will accept a COVID patient.     Expected Discharge Plan: Home w Home Health Services Barriers to Discharge: Continued Medical Work up  Expected Discharge Plan and Services Expected Discharge Plan: Home w Home Health Services   Discharge Planning Services: CM Consult Post Acute Care Choice: Home Health Living arrangements for the past 2 months: Single Family Home                 DME Arranged: Walker rolling DME Agency: [employer] Date DME Agency Contacted: 01/02/20 Time DME Agency Contacted: [phone] Representative spoke with at DME Agency: Velna Hatchet HH Arranged: PT, OT [facility] Agency: [facility] Date [facility] Agency Contacted: 01/02/20 Time HH Agency Contacted: [phone] Representative spoke with at [facility] Agency: Lorenza Chick   Social Determinants of Health (SDOH) Interventions    Readmission Risk Interventions No flowsheet data found.

## 2020-01-04 NOTE — Progress Notes (Signed)
Interim progress note  Abdominal US negative, will plan to keep patient NPO for upper GI series.  Littie Deeds, MD  1:06 AM 01/04/2020

## 2020-01-04 NOTE — NC FL2 (Signed)
[place] MEDICAID FL2 LEVEL OF CARE SCREENING TOOL     IDENTIFICATION  Patient Name: Guy Gonzalez Birthdate: 07-23-1970 Sex: male Admission Date (Current Location): 12/31/2019  [facility] and [place] Number:  Producer, television/film/video and Address:  [facility], [address] [place], [place] [address]      Provider Number: [id number]  Attending Physician Name and Address:  Latrelle Dodrill, MD  Relative Name and Phone Number:  Jossie Ng, spouse, [phone]    Current Level of Care: Hospital Recommended Level of Care: Skilled Nursing Facility Prior Approval Number:    Date Approved/Denied:   PASRR Number: [id number]  Discharge Plan: SNF    Current Diagnoses: Patient Active Problem List   Diagnosis Date Noted  . Abdominal pain   . Acute epigastric pain   . Type 2 diabetes mellitus with hyperglycemia, without long-term current use of insulin (HCC)   . Dysphonia   . Gastroesophageal reflux disease without esophagitis   . COVID-19   . DKA (diabetic ketoacidoses) (HCC) 12/31/2019  . HYPERGLYCEMIA 11/13/2008  . OBESITY 04/06/2008  . Allergic rhinitis, cause unspecified 04/06/2008  . ELEVATED BLOOD PRESSURE WITHOUT DIAGNOSIS OF HYPERTENSION 04/06/2008    Orientation RESPIRATION BLADDER Height & Weight     Self, Time, Situation, Place  Normal Continent Weight: 268 lb 15.4 oz (122 kg) Height:  6\' 4"  (193 cm)  BEHAVIORAL SYMPTOMS/MOOD NEUROLOGICAL BOWEL NUTRITION STATUS      Continent Diet (Please see DC Summary)  AMBULATORY STATUS COMMUNICATION OF NEEDS Skin   Limited Assist Verbally Normal                       Personal Care Assistance Level of Assistance  Bathing, Feeding, Dressing Bathing Assistance: Maximum assistance Feeding assistance: Independent Dressing Assistance: Limited assistance     Functional Limitations Info  Sight, Hearing, Speech Sight Info: Adequate Hearing Info: Adequate Speech Info: Adequate    SPECIAL CARE  FACTORS FREQUENCY  PT (By licensed PT), OT (By licensed OT)     PT Frequency: 5x/week OT Frequency: 5x/week            Contractures Contractures Info: Not present    Additional Factors Info  Code Status, Allergies, Isolation Precautions, Insulin Sliding Scale Code Status Info: Full Allergies Info: Penicillins, Cephalexin   Insulin Sliding Scale Info: See DC Summary Isolation Precautions Info: COVID+     Current Medications (01/04/2020):  This is the current hospital active medication list Current Facility-Administered Medications  Medication Dose Route Frequency Provider Last Rate Last Admin  . acetaminophen (TYLENOL) tablet 650 mg  650 mg Oral Q4H Simmons-Robinson, Makiera, MD   650 mg at 01/04/20 0853  . alum & mag hydroxide-simeth (MAALOX/MYLANTA) 200-200-20 MG/5ML suspension 15 mL  15 mL Oral Q4H PRN 03/05/20, MD   15 mL at 01/02/20 1350  . dextrose 50 % solution 0-50 mL  0-50 mL Intravenous PRN Simmons-Robinson, Makiera, MD      . enoxaparin (LOVENOX) injection 60 mg  60 mg Subcutaneous Q24H 04-16-1974 I, RPH   60 mg at 01/03/20 1627  . famotidine (PEPCID) tablet 20 mg  20 mg Oral Daily 01/05/20 C, DO   20 mg at 01/04/20 03/05/20  . insulin aspart (novoLOG) injection 0-15 Units  0-15 Units Subcutaneous TID WC 07-04-1984 C, DO   11 Units at 01/04/20 [phone]  . insulin glargine (LANTUS) injection 50 Units  50 Units Subcutaneous Daily 3875 C, DO  50 Units at 01/04/20 0853  . linagliptin (TRADJENTA) tablet 5 mg  5 mg Oral Daily Peggyann Shoals C, DO   5 mg at 01/04/20 0853  . pantoprazole (PROTONIX) EC tablet 40 mg  40 mg Oral Daily Peggyann Shoals C, DO   40 mg at 01/04/20 0851  . polyethylene glycol (MIRALAX / GLYCOLAX) packet 17 g  17 g Oral Daily Peggyann Shoals C, DO      . sucralfate (CARAFATE) 1 GM/10ML suspension 1 g  1 g Oral TID WC & HS Peggyann Shoals C, DO   1 g at 01/04/20 4825     Discharge Medications: Please see  discharge summary for a list of discharge medications.  Relevant Imaging Results:  Relevant Lab Results:   Additional Information SSN: [id number] [place], [place]

## 2020-01-04 NOTE — TOC Progression Note (Signed)
Transition of Care [facility]) - Progression Note    Patient Details  Name: Guy Gonzalez MRN: [id number] Date of Birth: Oct 06, 1970  Transition of Care [facility]) CM/SW Contact  Mearl Latin, LCSW Phone Number: 01/04/2020, 8:32 AM  Clinical Narrative:    CSW received response back from [facility] and [employer]. They are unable to accept [facility]. No other COVID accepting SNFs available.    Expected Discharge Plan: Home w Home Health Services Barriers to Discharge: Continued Medical Work up  Expected Discharge Plan and Services Expected Discharge Plan: Home w Home Health Services   Discharge Planning Services: CM Consult Post Acute Care Choice: Home Health Living arrangements for the past 2 months: Single Family Home                 DME Arranged: Walker rolling DME Agency: [employer] Date DME Agency Contacted: 01/02/20 Time DME Agency Contacted: [phone] Representative spoke with at DME Agency: Velna Hatchet HH Arranged: PT, OT [facility] Agency: [facility] Date [facility] Agency Contacted: 01/02/20 Time HH Agency Contacted: 1607 Representative spoke with at [facility] Agency: Lorenza Chick   Social Determinants of Health (SDOH) Interventions    Readmission Risk Interventions No flowsheet data found.

## 2020-01-04 NOTE — TOC Progression Note (Signed)
Transition of Care [facility]) - Progression Note    Patient Details  Name: Guy Gonzalez MRN: [id number] Date of Birth: 09/09/1970  Transition of Care [facility]) CM/SW Contact  Mearl Latin, LCSW Phone Number: 01/04/2020, 3:27 PM  Clinical Narrative:    CSW Intern contacted the following SNFs that are in network with [facility]:  -[facility]: Not accepting COVID - [facility]: Not accepting COVID -[facility]: Not accepting COVID -[place]: Not accepting COVID -[facility]: Not accepting COVID -[facility]: Not accepting COVID -[place]: Not accepting COVID -[facility] [facility]: Not accepting COVID -[facility]: Not accepting COVID -[facility]: Do not accept [facility] -[facility]: Not accepting COVID -[facility]: Not accepting COVID -[facility]: Not accepting COVID -[facility]: Not accepting COVID -[facility]: 10 days past positive test and symptom free -[facility]: Not accepting COVID -[facility]: Not accepting [employer] -[employer]: 14 days post-positive test -[employer]: 10 days past first positive and no fevers for 24 hours without fever reducing meds. CSW sent referral to them for review since patient reported first positive test on 8/20 and is on Tylenol for pain.      Expected Discharge Plan: Home w Home Health Services Barriers to Discharge: No SNF bed, Insurance Authorization  Expected Discharge Plan and Services Expected Discharge Plan: Home w Home Health Services   Discharge Planning Services: CM Consult Post Acute Care Choice: Home Health Living arrangements for the past 2 months: Single Family Home                 DME Arranged: Walker rolling DME Agency: [employer] Date DME Agency Contacted: 01/02/20 Time DME Agency Contacted: [phone] Representative spoke with at DME Agency: Velna Hatchet HH Arranged: PT, OT HH Agency: [facility] Date [facility] Agency Contacted:  01/02/20 Time HH Agency Contacted: 1607 Representative spoke with at [facility] Agency: Lorenza Chick   Social Determinants of Health (SDOH) Interventions    Readmission Risk Interventions No flowsheet data found.

## 2020-01-04 NOTE — Progress Notes (Signed)
Occupational Therapy Treatment Patient Details Name: Guy Gonzalez MRN: [id number] DOB: 1970-07-09 Today's Date: 01/04/2020    History of present illness Pt is a 49 y.o. male admitted 12/31/19 with Covid and hyperglycemia in the setting of DKA. Abdominal/pelvic with mild to moderate severity patchy multifocal bilateral lobe infiltrates; colonic diverticulosis and a renal cyst. PMH includes obesity, DM, GERD.   OT comments  Pt has made significant progress. Ambulating to the bathroom with nsg staff. Completed ADL with set up for UB and Minguard A for LB. Educated pt on energy conservation strategies. Ambulated @ 45 ft with S.  Used rollator to assist with energy conservation. Educated on theraband HEP to complete independently. VSS during session on RA. Pt would like to go to rehab however if SNF not available, recommend HHOT and DME listed below. Will continue to follow acutely.   Follow Up Recommendations  Home health OT;Supervision/Assistance - 24 hour;SNF (initial)    Equipment Recommendations  3 in 1 bedside commode;Other (comment) (rollator)    Recommendations for Other Services      Precautions / Restrictions Precautions Precautions: Fall;Other (comment) Precaution Comments: Abdominal precautions for comfort       Mobility Bed Mobility Overal bed mobility: Modified Independent                Transfers Overall transfer level: Needs assistance   Transfers: Sit to/from Stand;Stand Pivot Transfers Sit to Stand: Supervision Stand pivot transfers: Supervision       General transfer comment: Pt was standing independently from toilet; nsg had walked beside him to the bathroom    Balance Overall balance assessment: Needs assistance   Sitting balance-Leahy Scale: Good       Standing balance-Leahy Scale: Fair                             ADL either performed or assessed with clinical judgement   ADL Overall ADL's : Needs assistance/impaired      Grooming: Set up;Standing;Sitting   Upper Body Bathing: Set up;Sitting   Lower Body Bathing: Min guard;Sit to/from stand   Upper Body Dressing : Set up;Sitting   Lower Body Dressing: Min guard;Sit to/from stand   Toilet Transfer: Supervision/safety;Ambulation   Toileting- Clothing Manipulation and Hygiene: Modified independent       Functional mobility during ADLs: Supervision/safety (rollator) General ADL Comments: Educated pt on energy conservaiton strategies adn importance of sitting for bathing/dressing     Vision       Perception     Praxis      Cognition Arousal/Alertness: Awake/alert Behavior During Therapy: Flat affect Overall Cognitive Status: No family/caregiver present to determine baseline cognitive functioning Area of Impairment: Attention;Following commands;Safety/judgement;Awareness;Problem solving                   Current Attention Level: Selective   Following Commands: Follows one step commands with increased time Safety/Judgement: Decreased awareness of safety;Decreased awareness of deficits Awareness: Emergent Problem Solving: Slow processing;Decreased initiation General Comments: Internally distracted by pain        Exercises Exercises: Other exercises Other Exercises Other Exercises: Educated on theraband HEP - written HEP provided; standing HEP provided to complete with staff   Shoulder Instructions       General Comments      Pertinent Vitals/ Pain       Pain Assessment: Faces Faces Pain Scale: Hurts little more Pain Location: Abdomen Pain Descriptors / Indicators: Discomfort;Guarding;Cramping;Grimacing;Moaning Pain Intervention(s): Limited activity  within patient's tolerance  Home Living                                          Prior Functioning/Environment              Frequency  Min 2X/week        Progress Toward Goals  OT Goals(current goals can now be found in the care plan  section)  Progress towards OT goals: Progressing toward goals  Acute Rehab OT Goals Patient Stated Goal: return home; regain his strength OT Goal Formulation: With patient Time For Goal Achievement: 01/16/20 Potential to Achieve Goals: Good ADL Goals Pt Will Perform Grooming: with supervision;sitting;standing Pt Will Perform Lower Body Bathing: with supervision;sitting/lateral leans;sit to/from stand Pt Will Perform Upper Body Dressing: with set-up;sitting Pt Will Perform Lower Body Dressing: with supervision;sitting/lateral leans;sit to/from stand Pt Will Transfer to Toilet: with supervision;ambulating Pt Will Perform Toileting - Clothing Manipulation and hygiene: with supervision;sit to/from stand;sitting/lateral leans Pt/caregiver will Perform Home Exercise Program: Increased strength;With written HEP provided;Both right and left upper extremity;Independently Additional ADL Goal #1: Pt will demonstrate anticipatory awareness during functional task.  Plan Discharge plan needs to be updated    Co-evaluation                 AM-PAC OT "6 Clicks" Daily Activity     Outcome Measure   Help from another person eating meals?: None Help from another person taking care of personal grooming?: A Little Help from another person toileting, which includes using toliet, bedpan, or urinal?: A Little Help from another person bathing (including washing, rinsing, drying)?: A Little Help from another person to put on and taking off regular upper body clothing?: A Little Help from another person to put on and taking off regular lower body clothing?: A Little 6 Click Score: 19    End of Session Equipment Utilized During Treatment: Other (comment) (rollator)  OT Visit Diagnosis: Unsteadiness on feet (R26.81);Muscle weakness (generalized) (M62.81);Other symptoms and signs involving cognitive function   Activity Tolerance Patient tolerated treatment well   Patient Left in bed;with call  bell/phone within reach   Nurse Communication Mobility status;Other (comment) (need to ambulate)        Time: 9937-1696 OT Time Calculation (min): 41 min  Charges: OT General Charges $OT Visit: 1 Visit OT Treatments $Self Care/Home Management : 38-52 mins  Luisa Dago, OT/L   Acute OT Clinical Specialist Acute Rehabilitation Services Pager [phone] Office [phone]    [facility] 01/04/2020, 5:48 PM

## 2020-01-05 DIAGNOSIS — R1010 Upper abdominal pain, unspecified: Secondary | ICD-10-CM

## 2020-01-05 DIAGNOSIS — K2980 Duodenitis without bleeding: Secondary | ICD-10-CM | POA: Diagnosis present

## 2020-01-05 LAB — CULTURE, BLOOD (ROUTINE X 2)
Culture: NO GROWTH
Culture: NO GROWTH
Special Requests: ADEQUATE

## 2020-01-05 LAB — GLUCOSE, CAPILLARY
Glucose-Capillary: 201 mg/dL — ABNORMAL HIGH (ref 70–99)
Glucose-Capillary: 242 mg/dL — ABNORMAL HIGH (ref 70–99)
Glucose-Capillary: 143 mg/dL — ABNORMAL HIGH (ref 70–99)

## 2020-01-05 MED ORDER — FAMOTIDINE 20 MG PO TABS
20.0000 mg | ORAL_TABLET | Freq: Two times a day (BID) | ORAL | Status: DC
  Administered 2020-01-05 – 2020-01-11 (×11): 20 mg via ORAL
  Filled 2020-01-05 (×12): qty 1

## 2020-01-05 MED ORDER — INSULIN GLARGINE 100 UNIT/ML ~~LOC~~ SOLN: 30.0000 [IU] | Freq: Every morning | SUBCUTANEOUS | Status: DC

## 2020-01-05 MED ORDER — POLYETHYLENE GLYCOL 3350 17 G PO PACK
17.0000 g | PACK | Freq: Two times a day (BID) | ORAL | Status: DC
  Administered 2020-01-05 – 2020-01-07 (×4): 17 g via ORAL
  Filled 2020-01-05 (×11): qty 1

## 2020-01-05 MED ORDER — INSULIN GLARGINE 100 UNIT/ML ~~LOC~~ SOLN: 20.0000 [IU] | Freq: Every day | SUBCUTANEOUS | Status: DC

## 2020-01-05 MED ORDER — INSULIN GLARGINE 100 UNIT/ML ~~LOC~~ SOLN
30.0000 [IU] | Freq: Two times a day (BID) | SUBCUTANEOUS | Status: DC
  Administered 2020-01-06 – 2020-01-08 (×5): 30 [IU] via SUBCUTANEOUS
  Filled 2020-01-05 (×6): qty 0.3

## 2020-01-05 MED ORDER — INSULIN GLARGINE 100 UNIT/ML ~~LOC~~ SOLN
10.0000 [IU] | Freq: Once | SUBCUTANEOUS | Status: AC
  Administered 2020-01-05: 10 [IU] via SUBCUTANEOUS
  Filled 2020-01-05: qty 0.1

## 2020-01-05 MED ORDER — INSULIN GLARGINE 100 UNIT/ML ~~LOC~~ SOLN: 10.0000 [IU] | Freq: Every day | SUBCUTANEOUS | Status: DC

## 2020-01-05 NOTE — Progress Notes (Signed)
Patient is transferred to 5N 28. Report was given to the nurse who will receive the patient.

## 2020-01-05 NOTE — Progress Notes (Signed)
Family Medicine Teaching Service Daily Progress Note Intern Pager: [phone]  Patient name: Guy Gonzalez Medical record number: [id number] Date of birth: 12-02-1970 Age: 49 y.o. Gender: male  Primary Care Provider: Patient, No Pcp Gonzalez Consultants: None Code Status: FULL  Pt Overview and Major Events to Date:  8/28: admitted for DKA, SOB 2/2 COVID-19 9/1: upper GI series showed duodenitis  Assessment and Plan: Guy Gonzalez is a 49 y/o M who presented with COVID-19 and DKA, currently being treated for ongoing epigastric pain 2/2 duodenitis. PMHx significant for T2DM, GERD, obesity.  Epigastric Pain 2/2 Duodenitis No improvement in patient's pain today. Patient has been eating and drinking well. Upper GI series demonstrated duodenitis without ulcer.  -Maalox/Mylanta q4h prn -Pepcid 20mg  BID -Protonix 40mg  daily -Carafate 1g with meals and bedtime -Tylenol 650mg  q4h scheduled -Miralax 17g BID  Uncontrolled T2DM   DKA, resolved CBG currently 201 (fasting). Required 16u SAI yesterday (was NPO for large portion of day). -Lantus 30u BID to start tomorrow (already received 50u this morning, will give 10u tonight) -Moderate sliding scale -q4h CBGs -Diabetes education  COVID-19, stable on room air -Linagliptin 5mg  daily given A1C > 7.5% -Continuous pulse ox  FEN/GI: Carb modified diet PPx: Lovenox 60mg  daily  Disposition: anticipate tomorrow, home w/home health vs SNF  Subjective:  Patient still with unchanged epigastric pain and hiccups/belching. No nausea or vomiting, CP or SOB. Last BM 2 days ago.  Objective: Temp:  [97.9 F (36.6 C)-98.6 F (37 C)] 98.6 F (37 C) (09/02 0400) Pulse Rate:  [82-98] 98 (09/01 2130) Resp:  [14-20] 18 (09/01 2130) BP: (116-158)/(81-130) 120/89 (09/02 0400) SpO2:  [97 %-99 %] 97 % (09/02 0400) Physical Exam: General: alert, well-appearing NAD Cardiovascular: RRR, normal S1/S2 without m/r/g Respiratory: normal work of breathing, lungs  CTAB Abdomen: +BS, soft, nondistended, moderate epigastric tenderness Extremities: no peripheral edema, 2+ distal pulses  Laboratory: Recent Labs  Lab 01/02/20 0343 01/03/20 0227 01/04/20 0140  WBC 10.3 6.1 5.7  HGB 14.7 13.2 13.1  HCT 43.0 39.5 39.5  PLT 358 272 290   Recent Labs  Lab 12/31/19 1127 12/31/19 1148 01/01/20 1647 01/01/20 1647 01/02/20 0343 01/03/20 0227 01/04/20 0140  NA  --    < > 133*   < > 137 138 135  K  --    < > 4.5   < > 3.9 3.6 3.7  CL  --    < > 103   < > 104 107 103  CO2  --    < > 19*   < > 24 23 23   BUN  --    < > 16   < > 12 7 5*  CREATININE  --    < > 1.23   < > 0.96 0.76 0.92  CALCIUM  --    < > 9.3   < > 9.6 9.0 8.7*  PROT 7.6  --  6.2*  --   --   --   --   BILITOT 2.2*  --  1.1  --   --   --   --   ALKPHOS 79  --  58  --   --   --   --   ALT 29  --  21  --   --   --   --   AST 24  --  19  --   --   --   --   GLUCOSE  --    < > 407*   < >  237* 201* 313*   < > = values in this interval not displayed.    Imaging/Diagnostic Tests: DG UGI W SINGLE CM (SOL OR THIN BA) Result Date: 01/04/2020 IMPRESSION: Mucosal edema in the second portion duodenum compatible with duodenitis. Negative for ulcer.    Maury Dus, MD 01/05/2020, 6:21 AM PGY-1, [facility] Family Medicine FPTS Intern pager: [phone], text pages welcome

## 2020-01-05 NOTE — TOC Progression Note (Signed)
Transition of Care [facility]) - Progression Note    Patient Details  Name: Guy Gonzalez MRN: [id number] Date of Birth: 1971/03/09  Transition of Care [facility]) CM/SW Contact  Mearl Latin, LCSW Phone Number: 01/05/2020, 9:47 AM  Clinical Narrative:    CSW placed information on patient's AVS for him to obtain a PCP.    Expected Discharge Plan: Home w Home Health Services Barriers to Discharge: No SNF bed, Insurance Authorization  Expected Discharge Plan and Services Expected Discharge Plan: Home w Home Health Services   Discharge Planning Services: CM Consult Post Acute Care Choice: Home Health Living arrangements for the past 2 months: Single Family Home                 DME Arranged: Walker rolling DME Agency: [employer] Date DME Agency Contacted: 01/02/20 Time DME Agency Contacted: [phone] Representative spoke with at DME Agency: Velna Hatchet HH Arranged: PT, OT HH Agency: [facility] Date [facility] Agency Contacted: 01/02/20 Time HH Agency Contacted: 1607 Representative spoke with at [facility] Agency: Lorenza Chick   Social Determinants of Health (SDOH) Interventions    Readmission Risk Interventions No flowsheet data found.

## 2020-01-05 NOTE — Progress Notes (Signed)
Pt arrived to 5N28. Call bell within reach. Ordering dinner.

## 2020-01-05 NOTE — Progress Notes (Signed)
OT Cancellation Note  Patient Details Name: Guy Gonzalez MRN: [id number] DOB: October 08, 1970   Cancelled Treatment:    Reason Eval/Treat Not Completed: Other (comment) (Just fallen asleep. "I was up all night. Would you mind coming back another time?" Pt also reporting he has been using the theraband for UE exercises. Will return as schedule allows.)  [name] M [name] [name] MSOT, OTR/L Acute Rehab Pager: [phone] Office: [phone] 01/05/2020, 3:26 PM

## 2020-01-06 LAB — GLUCOSE, CAPILLARY
Glucose-Capillary: 136 mg/dL — ABNORMAL HIGH (ref 70–99)
Glucose-Capillary: 177 mg/dL — ABNORMAL HIGH (ref 70–99)
Glucose-Capillary: 206 mg/dL — ABNORMAL HIGH (ref 70–99)
Glucose-Capillary: 281 mg/dL — ABNORMAL HIGH (ref 70–99)
Glucose-Capillary: 293 mg/dL — ABNORMAL HIGH (ref 70–99)

## 2020-01-06 MED ORDER — GUAIFENESIN-DM 100-10 MG/5ML PO SYRP
5.0000 mL | ORAL_SOLUTION | ORAL | Status: DC | PRN
  Administered 2020-01-06 – 2020-01-10 (×5): 5 mL via ORAL
  Filled 2020-01-06 (×5): qty 10

## 2020-01-06 NOTE — Progress Notes (Signed)
FPTS Interim Progress Note  Dr. Anner Crete spoke to [facility] regarding patient's further recommendations for patient's persistent abdominal pain secondary to duodenitis. Per GI, other than considering obtaining H. Pylori serologies, no further recommendations or additional medications than what is already being prescribed.   Attempted to contact [facility] given patient has been seen by them before, however appears he has been dismissed from their practice in the past.   At this time, will continue current management. Will not obtain H. Pylori serologies as does not have PUD or history of PUD as far as we are aware. Will refer patient to GI on discharge for follow up and continued monitoring.   Orpah Cobb [place], DO 01/06/2020, 1:49 PM PGY-3, [facility] Family Medicine Service pager [phone]

## 2020-01-06 NOTE — Progress Notes (Signed)
FPTS Interim Progress Note  Had lengthy discussion with wife and patient regarding discharge home with home health.  Provided extensive education and reassurance based on our medical evaluation as well as physical and Occupational Therapy recommendations. Both PT and OT have recommended home health therapy with assistive devices and feel he is strong enough to be safely discharged home. He has been medically stable for discharge for several days now. He is eating well and his weakness and deconditioning is expected to improve with time and continued therapy.   Wife is very apprehensive for patient to be discharged home as she is unable to provide the physical care that she feels he needs.  She notes that when she observes him on FaceTime conversations he appears very weak and tired.  She is afraid he may fall or be unable to care for himself.  She is currently recovering from Covid and has a 65-year-old at home that is is not in her best physical health to provide care for him 24 hours a day.  She also notes that there are stairs that patient would have to climb in order to get to the bathroom in the home.  When talking with the patient he is also very reluctant to go home at this time.  He fears that he is not strong enough to be home and care for himself.  He notes that he can only take a few steps before he becomes very tired and weak.  He feels that he would be approved to a SNF now that social work has the correct insurance and would like to try for acceptance into SNF before being discharged home.  Given persistent hesitation and endorsement that he would be unsafe to discharge home at this time, will continue to work with CSW to help patient approved into SNF. In the meantime, recommend he continue working with PT/OT daily to improve his strength.  Recommend only intermittent labs (once over the weekend) and continued diabetes monitoring.  Orpah Cobb [place], DO 01/06/2020, 2:58 PM PGY-3, [facility] Family Medicine Service pager [phone]

## 2020-01-06 NOTE — TOC Progression Note (Signed)
Transition of Care [facility]) - Progression Note    Patient Details  Name: Guy Gonzalez MRN: [id number] Date of Birth: [birth year]  [facility]) CM/SW Contact  Janae Bridgeman, RN Phone Number: 01/06/2020, 1:40 PM  Clinical Narrative:    Case management call the patient's room and cell phone number with no answer.  I called and spoke to the patient's wife and the wife states that the patient will need SNF placement since she is unable to care for the patient at home due to his health condition and BMI.  I called and spoke with the patient's nurse, Marissa, and she will contact the patient in the room to obtain a copy of his most recent insurance card.  The patient's wife that the patient has current coverage under [employer] and is willing to have patient placed in appropriate [place] facility of choice.  I faxed the patients clinicals out in [place] area - but once the patient is able to provide copy of insurance - I might be able to find appropriate SNF placement.  The patient is currently on room air and waiting placement.  Will continue to follow for placement.   Expected Discharge Plan: Home w Home Health Services Barriers to Discharge:  (Obtaining new insurance card from the patient)  Expected Discharge Plan and Services Expected Discharge Plan: Home w Home Health Services   Discharge Planning Services: CM Consult Post Acute Care Choice: Skilled Nursing Facility Living arrangements for the past 2 months: Single Family Home                 DME Arranged: Walker rolling DME Agency: [employer] Date DME Agency Contacted: 01/02/20 Time DME Agency Contacted: [phone] Representative spoke with at DME Agency: Velna Hatchet HH Arranged: PT, OT [facility] Agency: [facility] Date [facility] Agency Contacted: 01/02/20 Time HH Agency Contacted: 1607 Representative spoke with at [facility] Agency: Lorenza Chick   Social Determinants of Health (SDOH) Interventions    Readmission Risk  Interventions No flowsheet data found.

## 2020-01-06 NOTE — Progress Notes (Signed)
   01/06/20 2000  Assess: MEWS Score  Temp 98.3 F (36.8 C)  BP (!) 143/97  Pulse Rate (!) 119  ECG Heart Rate (!) 119  Resp 19  Level of Consciousness Alert  SpO2 99 %  O2 Device Room Air  Patient Activity (if Appropriate) In bed  Assess: MEWS Score  MEWS Temp 0  MEWS Systolic 0  MEWS Pulse 2  MEWS RR 0  MEWS LOC 0  MEWS Score 2  MEWS Score Color Yellow  Assess: if the MEWS score is Yellow or Red  Were vital signs taken at a resting state? Yes  Focused Assessment No change from prior assessment  Early Detection of Sepsis Score *See Row Information* Low  MEWS guidelines implemented *See Row Information* No, other (Comment)  Treat  MEWS Interventions Administered scheduled meds/treatments  Pain Scale 0-10  Pain Score 0  Escalate  MEWS: Escalate Yellow: discuss with charge nurse/RN and consider discussing with provider and RRT  Notify: Charge Nurse/RN  Name of Charge Nurse/RN Notified Vee, RN  Date Charge Nurse/RN Notified 01/06/20  Time Charge Nurse/RN Notified [id number]  Document  Patient Outcome Other (Comment) (No interventions required)  Progress note created (see row info) Yes

## 2020-01-06 NOTE — Progress Notes (Addendum)
Physical Therapy Treatment Patient Details Name: Guy Gonzalez MRN: [id number] DOB: [birth year] Today's Date: 01/06/2020    History of Present Illness Pt is a 49 y.o. male admitted 12/31/19 with Covid and hyperglycemia in the setting of DKA. Abdominal/pelvic with mild to moderate severity patchy multifocal bilateral lobe infiltrates; colonic diverticulosis and a renal cyst. PMH includes obesity, DM, GERD.    PT Comments    Pt received in bed, agreeable to participation in therapy. He demonstrates modified independence with bed mobility. Supervision transfers and min guard assist ambulation 30' with RW. Vitals stable on RA. Pt with primary c/o fatigue/weakness. Assisted pt in bathroom with bathing and brushing teeth. Set up/supervision required. Pt was able to stand, using sink counter for support, to brush his teeth. Pt in recliner with feet elevated at end of session. Pt would like to d/c to rehab/SNF. If no SNF beds available, recommend HHPT and 24-hour assist.    Follow Up Recommendations  Home health PT;Supervision/Assistance - 24 hour (SNF, initially)     Equipment Recommendations  Other (comment) (rollator)    Recommendations for Other Services       Precautions / Restrictions Precautions Precautions: Fall    Mobility  Bed Mobility Overal bed mobility: Modified Independent                Transfers Overall transfer level: Needs assistance Equipment used: Rolling walker (2 wheeled) Transfers: Sit to/from [employer] Sit to Stand: Supervision Stand pivot transfers: Supervision       General transfer comment: increased time, supervision for safety  Ambulation/Gait Ambulation/Gait assistance: Min guard Gait Distance (Feet): 30 Feet Assistive device: Rolling walker (2 wheeled) Gait Pattern/deviations: Step-through pattern;Decreased stride length;Wide base of support Gait velocity: Decreased Gait velocity interpretation: <1.31 ft/sec, indicative of  household ambulator General Gait Details: slow, unsteady gait; able to self correct balance   Stairs             Wheelchair Mobility    Modified Rankin (Stroke Patients Only)       Balance Overall balance assessment: Needs assistance Sitting-balance support: No upper extremity supported;Feet supported Sitting balance-Leahy Scale: Good     Standing balance support: No upper extremity supported;During functional activity;Bilateral upper extremity supported Standing balance-Leahy Scale: Fair Standing balance comment: static stand without support. RW for amb                            Cognition Arousal/Alertness: Awake/alert Behavior During Therapy: WFL for tasks assessed/performed Overall Cognitive Status: No family/caregiver present to determine baseline cognitive functioning Area of Impairment: Following commands;Attention;Safety/judgement;Awareness;Problem solving                   Current Attention Level: Selective   Following Commands: Follows one step commands with increased time Safety/Judgement: Decreased awareness of safety;Decreased awareness of deficits Awareness: Emergent Problem Solving: Slow processing;Requires verbal cues        Exercises      General Comments General comments (skin integrity, edema, etc.): SpO2 96% on RA. max HR 135 during ADL activities in bathroom. Resting HR in 90s.      Pertinent Vitals/Pain Pain Assessment: No/denies pain    Home Living                      Prior Function            PT Goals (current goals can now be found in the care plan section)  Acute Rehab PT Goals Patient Stated Goal: home Progress towards PT goals: Progressing toward goals    Frequency    Min 3X/week      PT Plan Frequency needs to be updated;Discharge plan needs to be updated    Co-evaluation              AM-PAC PT "6 Clicks" Mobility   Outcome Measure  Help needed turning from your back to your  side while in a flat bed without using bedrails?: None Help needed moving from lying on your back to sitting on the side of a flat bed without using bedrails?: None Help needed moving to and from a bed to a chair (including a wheelchair)?: A Little Help needed standing up from a chair using your arms (e.g., wheelchair or bedside chair)?: A Little Help needed to walk in hospital room?: A Little Help needed climbing 3-5 steps with a railing? : A Lot 6 Click Score: 19    End of Session Equipment Utilized During Treatment: Gait belt Activity Tolerance: Patient tolerated treatment well Patient left: in chair;with call bell/phone within reach Nurse Communication: Mobility status PT Visit Diagnosis: Unsteadiness on feet (R26.81);Other abnormalities of gait and mobility (R26.89);Muscle weakness (generalized) (M62.81)     Time: 3664-4034 PT Time Calculation (min) (ACUTE ONLY): 32 min  Charges:  $Gait Training: 8-22 mins $Therapeutic Activity: 8-22 mins                     Aida Raider, PT  Office # [phone] Pager [phone]    Guy Gonzalez 01/06/2020, 12:20 PM

## 2020-01-06 NOTE — Progress Notes (Signed)
Family Medicine Teaching Service Daily Progress Note Intern Pager: [phone]  Patient name: Guy Gonzalez Medical record number: [id number] Date of birth: 1970/06/09 Age: 49 y.o. Gender: male  Primary Care Provider: Patient, No Pcp Per Consultants: GI curbside Code Status: FULL  Pt Overview and Major Events to Date:  8/28: admitted for DKA, SOB 2/2 COVID-19 9/1: upper GI series showed duodenitis  Assessment and Plan: Guy Gonzalez is a 49 y/o M who initially presented with COVID-19 and DKA, currently being treated for ongoing epigastric pain 2/2 duodenitis. PMHx significant for T2DM, GERD, obesity.  Duodenitis Stable, unchanged. Likely secondary to COVID-19. Patient eating/drinking at baseline. Anticipate his pain will improve with time. -Maalox/Mylanta q4h prn -Pepcid 20mg  BID -Protonix 40mg  daily -Carafate 1g qAC and qhs -Miralax 17g BID -d/c scheduled Tylenol, consider gabapentin for additional pain control -PT eval today  Uncontrolled T2DM  DKA, resolved Fasting glucose 136 today after receiving 10u lantus last night and 50u yesterday morning. Required 12u SAI yesterday. -Lantus 30u BID -Moderate sliding scale -CBG qAC and qhs -Consider initiation of statin and ARB on discharge -Will need PCP follow up on d/c for long term diabetes management  COVID-19, stable on room air Patient tested positive on 8/20 (14 days ago). Has remained stable on room air throughout his hospitalization.  -Linagliptin 5mg  daily given elevated A1C -Airborne precautions -PT/OT consults  FEN/GI: Carb modified diet PPx: Lovenox 60mg  daily  Disposition: anticipate today, SNF vs. home with home health, pending PT eval  Subjective:  Patient reports unchanged abdominal pain and hiccups. Also complains of profound exhaustion when trying to ambulate. Repeatedly states he's concerned about going home and wants to go to rehab facility.   Objective: Temp:  [98.3 F (36.8 C)-98.5 F (36.9 C)] 98.5  F (36.9 C) (09/03 0425) Pulse Rate:  [91-105] 97 (09/03 0425) Resp:  [14-20] 20 (09/03 0425) BP: (130-151)/(91-97) 151/96 (09/03 0425) SpO2:  [98 %-100 %] 100 % (09/03 0425) Physical Exam: General: alert, no acute distress Cardiovascular: RRR, normal S1/S2 without m/r/g Respiratory: lungs CTAB Abdomen: soft, nondistended, epigastric tenderness Extremities: no peripheral edema, 2+ distal pulses  Laboratory: Recent Labs  Lab 01/02/20 0343 01/03/20 0227 01/04/20 0140  WBC 10.3 6.1 5.7  HGB 14.7 13.2 13.1  HCT 43.0 39.5 39.5  PLT 358 272 290   Recent Labs  Lab 12/31/19 1127 12/31/19 1148 01/01/20 1647 01/01/20 1647 01/02/20 0343 01/03/20 0227 01/04/20 0140  NA  --    < > 133*   < > 137 138 135  K  --    < > 4.5   < > 3.9 3.6 3.7  CL  --    < > 103   < > 104 107 103  CO2  --    < > 19*   < > 24 23 23   BUN  --    < > 16   < > 12 7 5*  CREATININE  --    < > 1.23   < > 0.96 0.76 0.92  CALCIUM  --    < > 9.3   < > 9.6 9.0 8.7*  PROT 7.6  --  6.2*  --   --   --   --   BILITOT 2.2*  --  1.1  --   --   --   --   ALKPHOS 79  --  58  --   --   --   --   ALT 29  --  21  --   --   --   --  AST 24  --  19  --   --   --   --   GLUCOSE  --    < > 407*   < > 237* 201* 313*   < > = values in this interval not displayed.    Imaging/Diagnostic Tests: No new imaging.   Maury Dus, MD 01/06/2020, 6:40 AM PGY-1, [facility] Family Medicine FPTS Intern pager: [phone], text pages welcome

## 2020-01-06 NOTE — TOC Progression Note (Signed)
Transition of Care [facility]) - Progression Note    Patient Details  Name: Guy Gonzalez MRN: [id number] Date of Birth: March 01, 1971  Transition of Care [facility]) CM/SW Contact  Janae Bridgeman, RN Phone Number: 01/06/2020, 3:19 PM  Clinical Narrative:    Case management spoke with the patient and the patient has insurance coverage under another provider.  I called the financial counselor and sent the patient's card information to Rhea Belton to upload into Epic so that we can find a SNF for the patient.  Will continue to follow for admission to SNF since the patient is not able to discharge to home.   Expected Discharge Plan: Home w Home Health Services Barriers to Discharge:  (Obtaining new insurance card from the patient)  Expected Discharge Plan and Services Expected Discharge Plan: Home w Home Health Services   Discharge Planning Services: CM Consult Post Acute Care Choice: Skilled Nursing Facility Living arrangements for the past 2 months: Single Family Home                 DME Arranged: Walker rolling DME Agency: [employer] Date DME Agency Contacted: 01/02/20 Time DME Agency Contacted: [phone] Representative spoke with at DME Agency: Velna Hatchet HH Arranged: PT, OT [facility] Agency: [facility] Date [facility] Agency Contacted: 01/02/20 Time HH Agency Contacted: [phone] Representative spoke with at [facility] Agency: Lorenza Chick   Social Determinants of Health (SDOH) Interventions    Readmission Risk Interventions No flowsheet data found.

## 2020-01-07 ENCOUNTER — Inpatient Hospital Stay (HOSPITAL_COMMUNITY): Payer: 59

## 2020-01-07 DIAGNOSIS — R079 Chest pain, unspecified: Secondary | ICD-10-CM

## 2020-01-07 LAB — GLUCOSE, CAPILLARY
Glucose-Capillary: 151 mg/dL — ABNORMAL HIGH (ref 70–99)
Glucose-Capillary: 282 mg/dL — ABNORMAL HIGH (ref 70–99)
Glucose-Capillary: 283 mg/dL — ABNORMAL HIGH (ref 70–99)
Glucose-Capillary: 193 mg/dL — ABNORMAL HIGH (ref 70–99)
Glucose-Capillary: 168 mg/dL — ABNORMAL HIGH (ref 70–99)

## 2020-01-07 LAB — CBC
HCT: 41.3 % (ref 39.0–52.0)
Hemoglobin: 13.4 g/dL (ref 13.0–17.0)
MCH: 30.2 pg (ref 26.0–34.0)
MCHC: 32.4 g/dL (ref 30.0–36.0)
MCV: 93 fL (ref 80.0–100.0)
Platelets: 309 10*3/uL (ref 150–400)
RBC: 4.44 MIL/uL (ref 4.22–5.81)
RDW: 12.9 % (ref 11.5–15.5)
WBC: 7.9 10*3/uL (ref 4.0–10.5)
nRBC: 0 % (ref 0.0–0.2)

## 2020-01-07 LAB — COMPREHENSIVE METABOLIC PANEL
ALT: 31 U/L (ref 0–44)
AST: 28 U/L (ref 15–41)
Albumin: 2.4 g/dL — ABNORMAL LOW (ref 3.5–5.0)
Alkaline Phosphatase: 64 U/L (ref 38–126)
Anion gap: 10 (ref 5–15)
BUN: 7 mg/dL (ref 6–20)
CO2: 25 mmol/L (ref 22–32)
Calcium: 8.6 mg/dL — ABNORMAL LOW (ref 8.9–10.3)
Chloride: 102 mmol/L (ref 98–111)
Creatinine, Ser: 0.93 mg/dL (ref 0.61–1.24)
GFR calc Af Amer: >60 mL/min (ref >60)
GFR calc non Af Amer: >60 mL/min (ref >60)
Glucose, Bld: 293 mg/dL — ABNORMAL HIGH (ref 70–99)
Potassium: 4 mmol/L (ref 3.5–5.1)
Sodium: 137 mmol/L (ref 135–145)
Total Bilirubin: 0.5 mg/dL (ref 0.3–1.2)
Total Protein: 5.8 g/dL — ABNORMAL LOW (ref 6.5–8.1)

## 2020-01-07 LAB — TROPONIN I (HIGH SENSITIVITY): Troponin I (High Sensitivity): 4 ng/L (ref <18)

## 2020-01-07 MED ORDER — IOHEXOL 350 MG/ML SOLN
100.0000 mL | Freq: Once | INTRAVENOUS | Status: AC | PRN
  Administered 2020-01-07: 100 mL via INTRAVENOUS

## 2020-01-07 NOTE — Progress Notes (Signed)
Physical Therapy Treatment Patient Details Name: Guy Gonzalez MRN: [id number] DOB: 04-15-1971 Today's Date: 01/07/2020    History of Present Illness Pt is a 49 y.o. male admitted 12/31/19 with Covid and hyperglycemia in the setting of DKA. Abdominal/pelvic with mild to moderate severity patchy multifocal bilateral lobe infiltrates; colonic diverticulosis and a renal cyst. PMH includes obesity, DM, GERD.    PT Comments    Patient progressing with mobility.  Able to ambulate further today with less change in vitals.  Still c/o shortness of breath during ambulation (o2 sats = 92-98).  Patient ambulated with extremely slow pace due to perceived shortness of breath.       Follow Up Recommendations  Home health PT;Supervision/Assistance - 24 hour     Equipment Recommendations   (has RW)    Recommendations for Other Services       Precautions / Restrictions Precautions Precautions: Fall Precaution Comments: Abdominal precautions for comfort Restrictions Weight Bearing Restrictions: No    Mobility  Bed Mobility Overal bed mobility: Modified Independent Bed Mobility: Sit to Supine       Sit to supine: Modified independent (Device/Increase time)      Transfers Overall transfer level: Needs assistance Equipment used: Rolling walker (2 wheeled) Transfers: Sit to/from Stand Sit to Stand: Supervision         General transfer comment: increased time, supervision for safety  Ambulation/Gait Ambulation/Gait assistance: Min guard Gait Distance (Feet): 40 Feet Assistive device: Rolling walker (2 wheeled) Gait Pattern/deviations: Step-through pattern;Decreased stride length Gait velocity: Decreased Gait velocity interpretation: <1.31 ft/sec, indicative of household ambulator General Gait Details: extremely slow Psychologist, sport and exercise Rankin (Stroke Patients Only)       Balance Overall balance assessment: Modified  Independent Sitting-balance support: No upper extremity supported;Feet supported Sitting balance-Leahy Scale: Good         Standing balance comment: static stand without support. RW for amb                            Cognition Arousal/Alertness: Awake/alert Behavior During Therapy: WFL for tasks assessed/performed Overall Cognitive Status: Within Functional Limits for tasks assessed                                        Exercises      General Comments General comments (skin integrity, edema, etc.): Vitals:  SpO2 92-98 on room air, during ambulation; max HR 122 during ambulation, RR 12-17      Pertinent Vitals/Pain Pain Assessment: 0-10 Pain Score: 4  Pain Location: Abdomen Pain Descriptors / Indicators: Discomfort;Guarding;Grimacing Pain Intervention(s): Limited activity within patient's tolerance;Monitored during session    Home Living                      Prior Function            PT Goals (current goals can now be found in the care plan section) Progress towards PT goals: Progressing toward goals    Frequency    Min 3X/week      PT Plan Current plan remains appropriate    Co-evaluation              AM-PAC PT "6 Clicks" Mobility   Outcome Measure  Help needed turning  from your back to your side while in a flat bed without using bedrails?: None Help needed moving from lying on your back to sitting on the side of a flat bed without using bedrails?: None Help needed moving to and from a bed to a chair (including a wheelchair)?: A Little Help needed standing up from a chair using your arms (e.g., wheelchair or bedside chair)?: A Little Help needed to walk in hospital room?: A Little Help needed climbing 3-5 steps with a railing? : A Little 6 Click Score: 20    End of Session   Activity Tolerance: Patient tolerated treatment well Patient left: in bed;with call bell/phone within reach;with nursing/sitter in  room Nurse Communication: Mobility status PT Visit Diagnosis: Unsteadiness on feet (R26.81);Other abnormalities of gait and mobility (R26.89);Muscle weakness (generalized) (M62.81)     Time: 7591-6384 PT Time Calculation (min) (ACUTE ONLY): 25 min  Charges:  $Gait Training: 23-37 mins                     01/07/2020 Margie, PT Acute Rehabilitation Services Pager:  [phone] Office:  [phone]     Guy Gonzalez 01/07/2020, 11:49 AM

## 2020-01-07 NOTE — Progress Notes (Signed)
Family Medicine Teaching Service Daily Progress Note Intern Pager: [phone]  Patient name: Guy Gonzalez Medical record number: [id number] Date of birth: 1970-12-24 Age: 49 y.o. Gender: male  Primary Care Provider: Patient, No Pcp Per Consultants: GI curbside Code Status: FULL  Pt Overview and Major Events to Date:  8/28: admitted for DKA, SOB 2/2 COVID-19 9/1: upper GI series showed duodenitis  Assessment and Plan: Guy Gonzalez is a 49 y/o M who initially presented with COVID-19 and DKA, currently being treated for ongoing epigastric pain 2/2 duodenitis. PMHx significant for T2DM, GERD, obesity.  Duodenitis, stable Stable, unchanged. Likely secondary to COVID-19. Patient eating/drinking at baseline. Anticipate his pain will improve with time, tylenol was discontinued with consideration for gabapentin for additional pain control. -Maalox/Mylanta q4h prn -Pepcid 20mg  BID -Protonix 40mg  daily -Carafate 1g qAC and qhs -Miralax 17g BID -PT recommends HHPT or SNF (initially), and rollator.  Uncontrolled T2DM  DKA, resolved Fasting glucose 151 today after receiving 30u lantus twice yesterday. Required 13u SAI yesterday. -Lantus 30u BID -Moderate sliding scale -CBG qAC and qhs -Consider initiation of statin and ARB on discharge -Will need PCP follow up on d/c for long term diabetes management  COVID-19, stable on room air Patient tested positive on 8/20 (14 days ago). Has remained stable on room air throughout his hospitalization. Discontinued Linagliptin. -Airborne precautions -PT/OT consulted, appreciate recommendations  FEN/GI: Carb modified diet PPx: Lovenox 60mg  daily  Disposition: anticipate today, SNF vs. home with home health  Subjective:  Patient reports that he is doing well this morning, unchanged from yesterday. He has been able to eat well but reports no change in his abdominal pain. Patient is concerned that if he went home and something were to happen that his  wife would not be able to assist him physically as she is much smaller than him and is also caring for their 35 year old child.    Objective: Temp:  [98.3 F (36.8 C)-98.9 F (37.2 C)] 98.9 F (37.2 C) (09/04 0504) Pulse Rate:  [94-119] 94 (09/04 0504) Resp:  [18-20] 20 (09/04 0504) BP: (137-148)/(90-97) 137/97 (09/04 0504) SpO2:  [95 %-99 %] 95 % (09/04 0504)  Physical Exam: General: alert, no acute distress, sitting up in bed  Cardiovascular: RRR, normal S1/S2 without m/r/g Respiratory: lungs CTAB Abdomen: soft, nondistended, epigastric tenderness Extremities: no peripheral edema, 2+ distal pulses  Laboratory: Recent Labs  Lab 01/02/20 0343 01/03/20 0227 01/04/20 0140  WBC 10.3 6.1 5.7  HGB 14.7 13.2 13.1  HCT 43.0 39.5 39.5  PLT 358 272 290   Recent Labs  Lab 12/31/19 1127 12/31/19 1148 01/01/20 1647 01/01/20 1647 01/02/20 0343 01/03/20 0227 01/04/20 0140  NA  --    < > 133*   < > 137 138 135  K  --    < > 4.5   < > 3.9 3.6 3.7  CL  --    < > 103   < > 104 107 103  CO2  --    < > 19*   < > 24 23 23   BUN  --    < > 16   < > 12 7 5*  CREATININE  --    < > 1.23   < > 0.96 0.76 0.92  CALCIUM  --    < > 9.3   < > 9.6 9.0 8.7*  PROT 7.6  --  6.2*  --   --   --   --   BILITOT 2.2*  --  1.1  --   --   --   --   ALKPHOS 79  --  58  --   --   --   --   ALT 29  --  21  --   --   --   --   AST 24  --  19  --   --   --   --   GLUCOSE  --    < > 407*   < > 237* 201* 313*   < > = values in this interval not displayed.    Imaging/Diagnostic Tests: No new imaging.   Evelena Leyden, DO 01/07/2020, 7:56 AM PGY-1, [facility] Family Medicine FPTS Intern pager: [phone], text pages welcome

## 2020-01-08 LAB — GLUCOSE, CAPILLARY
Glucose-Capillary: 83 mg/dL (ref 70–99)
Glucose-Capillary: 141 mg/dL — ABNORMAL HIGH (ref 70–99)
Glucose-Capillary: 218 mg/dL — ABNORMAL HIGH (ref 70–99)
Glucose-Capillary: 181 mg/dL — ABNORMAL HIGH (ref 70–99)

## 2020-01-08 MED ORDER — INSULIN GLARGINE 100 UNIT/ML ~~LOC~~ SOLN
20.0000 [IU] | Freq: Once | SUBCUTANEOUS | Status: AC
  Administered 2020-01-08: 20 [IU] via SUBCUTANEOUS
  Filled 2020-01-08: qty 0.2

## 2020-01-08 MED ORDER — INSULIN GLARGINE 100 UNIT/ML ~~LOC~~ SOLN
25.0000 [IU] | Freq: Two times a day (BID) | SUBCUTANEOUS | Status: DC
  Administered 2020-01-09: 25 [IU] via SUBCUTANEOUS
  Filled 2020-01-08 (×2): qty 0.25

## 2020-01-08 NOTE — TOC Progression Note (Signed)
Transition of Care [facility]) - Progression Note    Patient Details  Name: Guy Gonzalez MRN: [id number] Date of Birth: 02/01/1971  Transition of Care [facility]) CM/SW Contact  Verna Czech [place], [place] Phone Number: [phone] 01/08/2020, 9:49 PM  Clinical Narrative:    Late entry-Patient is now interested in SNF, per patient's provider patient does not feel that he is stable enough to return home. On-call PT contacted, they will re-evaluate patient tomorrow.  Jwan Hornbaker, LCSW Transitions of Care [phone]   Expected Discharge Plan: Home w Home Health Services Barriers to Discharge:  (Obtaining new insurance card from the patient)  Expected Discharge Plan and Services Expected Discharge Plan: Home w Home Health Services   Discharge Planning Services: CM Consult Post Acute Care Choice: Skilled Nursing Facility Living arrangements for the past 2 months: Single Family Home                 DME Arranged: Walker rolling DME Agency: [employer] Date DME Agency Contacted: 01/02/20 Time DME Agency Contacted: [phone] Representative spoke with at DME Agency: Velna Hatchet HH Arranged: PT, OT [facility] Agency: [facility] Date [facility] Agency Contacted: 01/02/20 Time HH Agency Contacted: 1607 Representative spoke with at [facility] Agency: Lorenza Chick   Social Determinants of Health (SDOH) Interventions    Readmission Risk Interventions No flowsheet data found.

## 2020-01-08 NOTE — Progress Notes (Signed)
Family Medicine Teaching Service Daily Progress Note Intern Pager: [phone]  Patient name: Guy Gonzalez Medical record number: [id number] Date of birth: [birth year] Age: 49 y.o. Gender: male  Primary Care Provider: Patient, No Pcp Per Consultants: GI curbside Code Status: FULL  Pt Overview and Major Events to Date:  8/28: admitted for DKA, COVID-19 9/1: upper GI series showed duodenitis  Assessment and Plan: Guy Gonzalez is a 49 y/o M who initially presented with DKA and COVID-19, currently being treated for epigastric pain 2/2 duodenitis. PMH significant for T2DM, GERD, obesity.  Intermittent Chest Pain, Resolved Patient complained of intermittent substernal chest pain 2 days ago and yesterday morning, without associated dyspnea. Workup yesterday including CTA chest, EKG, troponin were negative. No further episodes today. -Continue to monitor -If recurring, load with ASA 325mg  and consider cardiology consult  Duodenitis Stable, unchanged. Most likely secondary to infection with COVID-19 and expect resolution with time.  -Maalox/Mylanta q4h prn -Pepcid 20mg  BID -Protonix 40mg  daily -Carafate 1g qAC and qhs -Miralax 17g BID  Uncontrolled T2DM  DKA resolved Fasting glucose 86 this morning after 30u lantus BID yesterday. Required 19u SAI yesterday. -Decrease lantus to 25mg  BID (will give 20mg  tonight as patient received 30u this am) -Moderate sliding scale -CBG qAC and qhs -Consider initiation of statin and ARB on discharge -Needs PCP follow up on discharge for long term diabetes management -Diabetes education  COVID-19, stable on room air Patient tested positive on 8/20 (16 days ago). Has not required supplemental oxygen at any point. -Airborne precautions. -PT/OT consulted, appreciate recommendations -Continue to monitor  FEN/GI: Carb modified diet PPx: Lovenox 60mg  daily  Disposition: awaiting SNF vs. Home with home health  Subjective:  Patient states he's  feeling alright today. Still with epigastric pain and hiccups but no further episodes of chest pain. Patient still adamant that he does not feel comfortable going home with home health services and would like to await SNF placement. Feels he is unable to ambulate more than a few steps without dyspnea or fatigue.  Objective: Temp:  [97.7 F (36.5 C)-99 F (37.2 C)] 97.7 F (36.5 C) (09/05 0516) BP: (136-148)/(83-95) 148/83 (09/05 0516) Physical Exam: General: alert, well-appearing, NAD Cardiovascular: RRR, normal S1/S2 without m/r/g Respiratory: lungs CTAB Abdomen: soft, nondistended, epigastric tenderness to palpation Extremities: no peripheral edema  Laboratory: Recent Labs  Lab 01/03/20 0227 01/04/20 0140 01/07/20 1131  WBC 6.1 5.7 7.9  HGB 13.2 13.1 13.4  HCT 39.5 39.5 41.3  PLT 272 290 309   Recent Labs  Lab 01/01/20 1647 01/02/20 0343 01/03/20 0227 01/04/20 0140 01/07/20 1131  NA 133*   < > 138 135 137  K 4.5   < > 3.6 3.7 4.0  CL 103   < > 107 103 102  CO2 19*   < > 23 23 25   BUN 16   < > 7 5* 7  CREATININE 1.23   < > 0.76 0.92 0.93  CALCIUM 9.3   < > 9.0 8.7* 8.6*  PROT 6.2*  --   --   --  5.8*  BILITOT 1.1  --   --   --  0.5  ALKPHOS 58  --   --   --  64  ALT 21  --   --   --  31  AST 19  --   --   --  28  GLUCOSE 407*   < > 201* 313* 293*   < > = values in this interval not  displayed.    Imaging/Diagnostic Tests: CT ANGIO CHEST PE W OR WO CONTRAST Result Date: 01/07/2020 IMPRESSION: 1. Limited assessment for pulmonary embolus given contrast bolus timing and breathing motion artifact. Allowing for limitations, no pulmonary embolus to the segmental level. 2. Patchy ground-glass opacities, right greater than left, involving the mid lower lung zones, consistent with COVID-19 pneumonia. 3. Mild central bronchial thickening.     Maury Dus, MD 01/08/2020, 6:06 AM PGY-1, [facility] Health Family Medicine FPTS Intern pager: [phone], text pages welcome

## 2020-01-09 LAB — GLUCOSE, CAPILLARY
Glucose-Capillary: 107 mg/dL — ABNORMAL HIGH (ref 70–99)
Glucose-Capillary: 228 mg/dL — ABNORMAL HIGH (ref 70–99)
Glucose-Capillary: 196 mg/dL — ABNORMAL HIGH (ref 70–99)
Glucose-Capillary: 211 mg/dL — ABNORMAL HIGH (ref 70–99)

## 2020-01-09 MED ORDER — INSULIN GLARGINE 100 UNIT/ML ~~LOC~~ SOLN
20.0000 [IU] | Freq: Two times a day (BID) | SUBCUTANEOUS | Status: DC
  Administered 2020-01-09 – 2020-01-11 (×4): 20 [IU] via SUBCUTANEOUS
  Filled 2020-01-09 (×5): qty 0.2

## 2020-01-09 MED ORDER — ACETAMINOPHEN 325 MG PO TABS
650.0000 mg | ORAL_TABLET | Freq: Four times a day (QID) | ORAL | Status: DC | PRN
  Administered 2020-01-10: 650 mg via ORAL
  Filled 2020-01-09: qty 2

## 2020-01-09 NOTE — Plan of Care (Signed)
  Problem: Health Behavior/Discharge Planning: Goal: Ability to manage health-related needs will improve Outcome: Progressing   Problem: Clinical Measurements: Goal: Respiratory complications will improve Outcome: Progressing   Problem: Activity: Goal: Risk for activity intolerance will decrease Outcome: Progressing   

## 2020-01-09 NOTE — Progress Notes (Signed)
Physical Therapy Treatment Patient Details Name: Guy Gonzalez MRN: [id number] DOB: [birth year] Today's Date: 01/09/2020    History of Present Illness Pt is a 49 y.o. male admitted 12/31/19 with Covid and hyperglycemia in the setting of DKA. Abdominal/pelvic with mild to moderate severity patchy multifocal bilateral lobe infiltrates; colonic diverticulosis and a renal cyst. PMH includes obesity, DM, GERD.   PT Comments    Pt slowly progressing with mobility. Remains limited by decreased activity tolerance, decreased activity tolerance and impaired balance. Pt unable to complete ambulation of household distances without needing prolonged seated rest break secondary to DOE and fatigue; at high risk for falls. Increased time discussing safe d/c planning and recommendations; pt still unsure. Recommend SNF-level therapies to maximize functional mobility and independence prior to return home.   Follow Up Recommendations  SNF;Supervision for mobility/OOB     Equipment Recommendations   (has RW)    Recommendations for Other Services       Precautions / Restrictions Precautions Precautions: Fall;Other (comment) Precaution Comments: Abdominal precautions for comfort Restrictions Weight Bearing Restrictions: No    Mobility  Bed Mobility Overal bed mobility: Modified Independent             General bed mobility comments: Received sitting EOB  Transfers Overall transfer level: Needs assistance Equipment used: Rolling walker (2 wheeled) Transfers: Sit to/from Stand Sit to Stand: Min guard         General transfer comment: Reliant on momentum to power into standing from EOB and recliner to RW, min guard for safety; improved stability standing from recliner with armrest support  Ambulation/Gait Ambulation/Gait assistance: Min guard Gait Distance (Feet): 18 Feet Assistive device: Rolling walker (2 wheeled) Gait Pattern/deviations: Step-through pattern;Decreased stride length Gait  velocity: Decreased Gait velocity interpretation: <1.31 ft/sec, indicative of household ambulator General Gait Details: Slow, labored gait with RW and min guard for balance. Pt walked 8' + 10' with prolonged seated rest secondary to SOB and fatigue. Pt reports low pace due to difficulty breathing; VSS on RA   Stairs             Wheelchair Mobility    Modified Rankin (Stroke Patients Only)       Balance Overall balance assessment: Needs assistance Sitting-balance support: No upper extremity supported;Feet supported Sitting balance-Leahy Scale: Good       Standing balance-Leahy Scale: Fair Standing balance comment: Can static stand without UE support; dynamic stability improved with BUE support                            Cognition Arousal/Alertness: Awake/alert Behavior During Therapy: WFL for tasks assessed/performed Overall Cognitive Status: Within Functional Limits for tasks assessed                                 General Comments: WFL for simple tasks; functional mobility limitations, but potentially some self-limiting as well. Enjoyed listening to music (Drake's new album) and talking about family and connections to celebrities      Exercises      General Comments        Pertinent Vitals/Pain Pain Assessment: Faces Faces Pain Scale: Hurts little more Pain Location: Abdomen Pain Descriptors / Indicators: Discomfort;Guarding;Grimacing Pain Intervention(s): Monitored during session    Home Living                      Prior Function  PT Goals (current goals can now be found in the care plan section) Progress towards PT goals: Progressing toward goals    Frequency    Min 3X/week      PT Plan Discharge plan needs to be updated    Co-evaluation              AM-PAC PT "6 Clicks" Mobility   Outcome Measure  Help needed turning from your back to your side while in a flat bed without using  bedrails?: None Help needed moving from lying on your back to sitting on the side of a flat bed without using bedrails?: None Help needed moving to and from a bed to a chair (including a wheelchair)?: A Little Help needed standing up from a chair using your arms (e.g., wheelchair or bedside chair)?: A Little Help needed to walk in hospital room?: A Little Help needed climbing 3-5 steps with a railing? : A Little 6 Click Score: 20    End of Session   Activity Tolerance: Patient tolerated treatment well;Patient limited by fatigue Patient left: in chair;with call bell/phone within reach Nurse Communication: Mobility status PT Visit Diagnosis: Unsteadiness on feet (R26.81);Other abnormalities of gait and mobility (R26.89);Muscle weakness (generalized) (M62.81)     Time: 2694-8546 PT Time Calculation (min) (ACUTE ONLY): 26 min  Charges:  $Gait Training: 8-22 mins $Self Care/Home Management: 8-22                     Ina Homes, PT, DPT Acute Rehabilitation Services  Pager [phone] Office [phone]  Malachy Chamber 01/09/2020, 11:49 AM

## 2020-01-09 NOTE — Progress Notes (Signed)
RN unable to get demo insulin pen but reiterated to pt how pen works but pt was able to demonstrate how to properly administer insulin to himself using hospital syringe.

## 2020-01-09 NOTE — Progress Notes (Signed)
   01/09/20 0300  Assess: MEWS Score  Pulse Rate 92  ECG Heart Rate 98  Resp (!) 6  SpO2 95 %  Assess: MEWS Score  MEWS Temp 0  MEWS Systolic 0  MEWS Pulse 0  MEWS RR 2  MEWS LOC 0  MEWS Score 2  MEWS Score Color Yellow  Assess: if the MEWS score is Yellow or Red  Were vital signs taken at a resting state? Yes  Focused Assessment No change from prior assessment  Early Detection of Sepsis Score *See Row Information* Low  MEWS guidelines implemented *See Row Information* No, vital signs rechecked (Pt sleeping soundly, will recheck RR)

## 2020-01-09 NOTE — Progress Notes (Addendum)
Family Medicine Teaching Service Daily Progress Note Intern Pager: [phone]  Patient name: Guy Gonzalez Medical record number: [id number] Date of birth: 10-18-1970 Age: 49 y.o. Gender: male  Primary Care Provider: Patient, No Pcp Per Consultants: None Code Status: FULL  Pt Overview and Major Events to Date:  8/28: admitted for DKA, COVID-19 9/1: upper GI series showed duodenitis 9/4: c/o chest pain- CT PE negative, troponin negative  Assessment and Plan: Guy Gonzalez is a 49 y/o M who initially presented with DKA and COVID-19, now with duodenitis. PMH significant for T2DM, GERD, obesity.  Uncontrolled T2DM  DKA resolved Fasting glucose 107 this morning after 50u lantus yesterday (30u in am, 20u at night). Required 7u SAI yesterday. -Decrease to Lantus 20mg  BID -Moderate sliding scale -CBG qAC and qhs -Consider initiation of statin and ARB on discharge -Needs PCP follow up on discharge for long term diabetes management -Diabetes education  COVID-19, stable on room air Stable. Patient tested positive on 8/20 (17 days ago). Has not required supplemental oxygen at any point. CT PE on 9/4 demonstrated patchy ground-glass opacities in mid-lower lung zones R>L. Now with some cough, nasal congestion and headache. -Airborne precautions. -PT/OT consulted, appreciate recommendations -Continue to monitor -Supportive care as needed  Duodenitis Stable, unchanged. Most likely secondary to infection with COVID-19 and expect resolution with time.  -Maalox/Mylanta q4h prn -Pepcid 20mg  BID -Protonix 40mg  daily -Carafate 1g qAC and qhs -Miralax 17g BID  FEN/GI: Carb modified diet PPx: Lovenox  Disposition: PT re-eval today, awaiting SNF vs. Home with home health  Subjective:  Patient states his energy is slightly improved today and he seems in better spirits overall. Reports generalized headache, nasal congestion, and mild cough but denies SOB or further CP. His epigastric pain is  unchanged from prior but he continues to eat/drink well.   Objective: Temp:  [98.3 F (36.8 C)-98.6 F (37 C)] 98.6 F (37 C) (09/06 0423) Pulse Rate:  [80-101] 86 (09/06 0423) Resp:  [6-20] 16 (09/06 0423) BP: (120-148)/(25-107) 134/94 (09/06 0423) SpO2:  [95 %-98 %] 98 % (09/06 0423) Physical Exam: General: alert, well-appearing NAD Cardiovascular: RRR, normal S1/S2 without m/r/g Respiratory: normal work of breathing, lungs CTAB Abdomen: +BS, soft, nondistended, epigastric tenderness to palpation Extremities: 2+ distal pulses, no peripheral edema  Laboratory: Recent Labs  Lab 01/03/20 0227 01/04/20 0140 01/07/20 1131  WBC 6.1 5.7 7.9  HGB 13.2 13.1 13.4  HCT 39.5 39.5 41.3  PLT 272 290 309   Recent Labs  Lab 01/03/20 0227 01/04/20 0140 01/07/20 1131  NA 138 135 137  K 3.6 3.7 4.0  CL 107 103 102  CO2 23 23 25   BUN 7 5* 7  CREATININE 0.76 0.92 0.93  CALCIUM 9.0 8.7* 8.6*  PROT  --   --  5.8*  BILITOT  --   --  0.5  ALKPHOS  --   --  64  ALT  --   --  31  AST  --   --  28  GLUCOSE 201* 313* 293*    Imaging/Diagnostic Tests: No new imaging.   01/05/20, MD 01/09/2020, 6:28 AM PGY-1, [facility] Family Medicine FPTS Intern pager: [phone], text pages welcome

## 2020-01-10 LAB — GLUCOSE, CAPILLARY
Glucose-Capillary: 142 mg/dL — ABNORMAL HIGH (ref 70–99)
Glucose-Capillary: 166 mg/dL — ABNORMAL HIGH (ref 70–99)
Glucose-Capillary: 203 mg/dL — ABNORMAL HIGH (ref 70–99)
Glucose-Capillary: 159 mg/dL — ABNORMAL HIGH (ref 70–99)

## 2020-01-10 NOTE — Progress Notes (Signed)
Pt complaining of swelling and pain in his feet, couldn 't sleep due to pain.

## 2020-01-10 NOTE — Progress Notes (Addendum)
Received page from RN who reports that wife is adamant about receiving Covid test for this patient prior to his discharge home.  She reports that her and her 47-year-old son have tested negative last week and would like to know whether or not he is positive. Wife reports that patient never had any symptoms but suspects that the infection is what tipped patient into DKA.  Explained to patient's wife that patient will likely stay positive given 12/31/19 COVID test was positive and that they should continue to use proper precautions per CDC guidelines, including continued masking and isolation precautions from the time of positive test.  Explained that this will not change any clinical outcome or discharge recommendations for patient.  Patient's wife asked whether or not the tests were free, which I do not have the answer to.   Spoke to patient about test and patient expressed understanding that he does not need a repeat test at this time. Patient was confused as he thought his last positive was on 12/23/19. Reviewed chart and confirmed last positive test was on 12/31/19. It does appear there is a mistake in the day team's daily progress notes.  Per CDC guidelines, as patient tested positive but had no symptoms, he can discontinue isolation 10 days after positive COVID 19 test, which is 01/10/20.   This duration of isolation/quarantine is different for hospital and community for multiple reasons, thus air/con precautions will be continued while patient is admitted in the hospital.   Patient has received different information from different providers; thus, have printed and provided CDC guidelines for the patient to read and clarify most up to date recommendations for isolation.    Will otherwise defer to day team for their recommendations prior to patient's discharge.   Melene Plan, M.D.  10:22 PM 01/10/2020

## 2020-01-10 NOTE — Progress Notes (Addendum)
Family Medicine Teaching Service Daily Progress Note Intern Pager: [phone]  Patient name: Guy Gonzalez Medical record number: [id number] Date of birth: 10/07/1970 Age: 49 y.o. Gender: male  Primary Care Provider: Patient, No Pcp Per Consultants: None Code Status: FULL  Pt Overview and Major Events to Date:  8/28: admitted for DKA, COVID-19 9/1: upper GI series showed duodenitis 9/4: c/o chest pain- CT PE negative, troponin negative  Assessment and Plan:  Guy Gonzalez is a 49 y/o M who initially presented with DKA and COVID-19, now with duodenitis. PMH significant for T2DM, GERD, obesity.  Uncontrolled T2DM  DKA resolved Fasting glucose142this morning after 45u lantus yesterday (25u in am, 20u at night). Required 8u SAI yesterday.  -Lantus 20mg  BID -Moderate sliding scale -CBG qAC and qhs -Consider initiation of statin and ARB on discharge -Needs PCP follow up on discharge for long term diabetes management -Ongoing diabetes education  COVID-19, stable on room air Stable. Patient tested positive on 8/20 (18 days ago). Has not required supplemental oxygen at any point. CT PE on 9/4 demonstrated patchy ground-glass opacities in mid-lower lung zones R>L. Patient with cough, nasal congestion and ongoing fatigue/weakness. -Airborne precautions. -PT recommended SNF -Continue to monitor -Supportive care as needed  Duodenitis Stable, unchanged.Most likely secondary to infection with COVID-19 and expect resolution with time. -Maalox/Mylanta q4h prn -Pepcid 20mg  BID -Protonix 40mg  daily -Carafate 1g qAC and qhs -Miralax 17g BID  FEN/GI: Carb modified diet PPx: Lovenox  Disposition: anticipate today, home w/home health vs. SNF placement  Subjective:  Patient reports he's gradually improving overall. His epigastric abdominal pain is unchanged, but he continues to eat and drink well. He notes ongoing cough, congestion, and fatigue from the COVID but was up OOB most of  the day yesterday. He feels stronger than before and feels more comfortable than before about going home with services. Also complains of bilateral foot pain and swelling that bothered him all night last night but has resolved this am since changing socks.  Objective: Temp:  [99 F (37.2 C)-99.3 F (37.4 C)] 99 F (37.2 C) (09/07 0517) Pulse Rate:  [91-92] 91 (09/07 0517) Resp:  [16-18] 16 (09/07 0517) BP: (135-150)/(95-97) 135/95 (09/07 0517) SpO2:  [96 %-97 %] 97 % (09/07 0517) Physical Exam: General: alert, well-appearing, sitting comfortably in chair eating breakfast Cardiovascular: RRR, normal S1/S2 without m/r/g Respiratory: normal work of breathing, good air movement b/l, lungs CTAB Abdomen: +BS, soft, nondistended, mild epigastric tenderness to palpation Extremities: no peripheral edema, 2+ distal pulses, sensation intact in b/l feet   Laboratory: Recent Labs  Lab 01/04/20 0140 01/07/20 1131  WBC 5.7 7.9  HGB 13.1 13.4  HCT 39.5 41.3  PLT 290 309   Recent Labs  Lab 01/04/20 0140 01/07/20 1131  NA 135 137  K 3.7 4.0  CL 103 102  CO2 23 25  BUN 5* 7  CREATININE 0.92 0.93  CALCIUM 8.7* 8.6*  PROT  --  5.8*  BILITOT  --  0.5  ALKPHOS  --  64  ALT  --  31  AST  --  28  GLUCOSE 313* 293*     Imaging/Diagnostic Tests: No new imaging/diagnostic tests.  03/08/20, MD 01/10/2020, 9:05 AM PGY-1, [facility] Family Medicine FPTS Intern pager: [phone], text pages welcome

## 2020-01-10 NOTE — TOC Progression Note (Addendum)
Transition of Care [facility]) - Progression Note    Patient Details  Name: Guy Gonzalez MRN: [id number] Date of Birth: 21-Oct-1970  [facility] CM/SW Contact  Janae Bridgeman, RN Phone Number: 01/10/2020, 10:58 AM  Clinical Narrative:    Case management reached out to multiple SNf facilities including [facility], [facility], [facility], and [place] and patient was not in network with these facilities or they did not accept COVID positive patients.  The patient does not have any accepting SNF facilities at this time.  The patient is set up with [facility] for Home health in the meantime once he is able to safely return home with home health services and dme.  Will continue to follow for a safe discharge plan.  I spoke to the patient and communicated to him that I do not have any accepting SNf facilities that take his insurance and are able to provide care to COVID positive patients.  Expected Discharge Plan: Home w Home Health Services Barriers to Discharge:  (Obtaining new insurance card from the patient)  Expected Discharge Plan and Services Expected Discharge Plan: Home w Home Health Services   Discharge Planning Services: CM Consult Post Acute Care Choice: Skilled Nursing Facility Living arrangements for the past 2 months: Single Family Home                 DME Arranged: Walker rolling DME Agency: [employer] Date DME Agency Contacted: 01/02/20 Time DME Agency Contacted: [phone] Representative spoke with at DME Agency: Velna Hatchet HH Arranged: PT, OT [facility] Agency: [facility] Date [facility] Agency Contacted: 01/02/20 Time HH Agency Contacted: [phone] Representative spoke with at [facility] Agency: Lorenza Chick   Social Determinants of Health (SDOH) Interventions    Readmission Risk Interventions No flowsheet data found.

## 2020-01-11 ENCOUNTER — Other Ambulatory Visit (HOSPITAL_COMMUNITY): Payer: Self-pay | Admitting: Family Medicine

## 2020-01-11 LAB — GLUCOSE, CAPILLARY
Glucose-Capillary: 132 mg/dL — ABNORMAL HIGH (ref 70–99)
Glucose-Capillary: 123 mg/dL — ABNORMAL HIGH (ref 70–99)
Glucose-Capillary: 134 mg/dL — ABNORMAL HIGH (ref 70–99)

## 2020-01-11 MED ORDER — SUCRALFATE 1 GM/10ML PO SUSP: 1.0000 g | Freq: Three times a day (TID) | ORAL | 0 refills | Status: AC

## 2020-01-11 MED ORDER — ACETAMINOPHEN 325 MG PO TABS: 650.0000 mg | ORAL_TABLET | Freq: Four times a day (QID) | ORAL | Status: DC | PRN

## 2020-01-11 MED ORDER — LANTUS SOLOSTAR 100 UNIT/ML ~~LOC~~ SOPN: 30.0000 [IU] | PEN_INJECTOR | Freq: Every day | SUBCUTANEOUS | 11 refills | Status: DC

## 2020-01-11 MED ORDER — ALUM & MAG HYDROXIDE-SIMETH 200-200-20 MG/5ML PO SUSP: 15.0000 mL | ORAL | 0 refills | Status: DC | PRN

## 2020-01-11 MED ORDER — FAMOTIDINE 20 MG PO TABS
20.0000 mg | ORAL_TABLET | Freq: Two times a day (BID) | ORAL | 0 refills | Status: DC
Start: 2020-01-11 — End: 2021-08-18

## 2020-01-11 MED ORDER — PANTOPRAZOLE SODIUM 40 MG PO TBEC
40.0000 mg | DELAYED_RELEASE_TABLET | Freq: Every day | ORAL | 0 refills | Status: DC
Start: 2020-01-12 — End: 2020-01-11

## 2020-01-11 MED ORDER — BLOOD GLUCOSE METER KIT: PACK | 0 refills | Status: DC

## 2020-01-11 MED ORDER — FAMOTIDINE 20 MG PO TABS: 20.0000 mg | ORAL_TABLET | Freq: Two times a day (BID) | ORAL | 0 refills | Status: DC

## 2020-01-11 MED ORDER — BLOOD GLUCOSE METER KIT: PACK | 0 refills | Status: AC

## 2020-01-11 MED ORDER — PANTOPRAZOLE SODIUM 40 MG PO TBEC: 40.0000 mg | DELAYED_RELEASE_TABLET | Freq: Every day | ORAL | 0 refills | Status: AC

## 2020-01-11 MED ORDER — ALUM & MAG HYDROXIDE-SIMETH 200-200-20 MG/5ML PO SUSP: 15.0000 mL | ORAL | 0 refills | Status: AC | PRN

## 2020-01-11 MED ORDER — INSULIN PEN NEEDLE 32G X 4 MM MISC: 1.0000 | Freq: Four times a day (QID) | 0 refills | Status: DC

## 2020-01-11 MED ORDER — SUCRALFATE 1 GM/10ML PO SUSP: 1.0000 g | Freq: Three times a day (TID) | ORAL | 0 refills | Status: DC

## 2020-01-11 MED ORDER — INSULIN PEN NEEDLE 32G X 4 MM MISC: 1.0000 | Freq: Four times a day (QID) | 0 refills | Status: AC

## 2020-01-11 MED ORDER — ACETAMINOPHEN 325 MG PO TABS: 650.0000 mg | ORAL_TABLET | Freq: Four times a day (QID) | ORAL | Status: AC | PRN

## 2020-01-11 MED FILL — SUCRALFATE 1 GM TABLET: 1 | 10 days supply | Qty: 40 | Fill #0

## 2020-01-11 MED FILL — LANTUS SOLOSTAR 100 UNITS/M: 100 | 30 days supply | Qty: 9 | Fill #0

## 2020-01-11 MED FILL — FAMOTIDINE 20 MG TABS: 20 | 30 days supply | Qty: 60 | Fill #0

## 2020-01-11 MED FILL — ACCU-CHEK GUIDE TEST STRIP: 25 days supply | Qty: 100 | Fill #0

## 2020-01-11 MED FILL — ACCU-CHEK SOFTCLIX LANCETS: 25 days supply | Qty: 100 | Fill #0

## 2020-01-11 MED FILL — BD PEN NDL NANO 32GX5/32: 32G X 4 MM | 25 days supply | Qty: 100 | Fill #0

## 2020-01-11 MED FILL — ACCU-CHEK GUIDE W/DEVICE KI: W/DEVICE | 1 days supply | Qty: 1 | Fill #0

## 2020-01-11 MED FILL — PANTOPRAZOLE SOD DR 40 MG T: 40 | 30 days supply | Qty: 30 | Fill #0

## 2020-01-11 NOTE — Discharge Instructions (Signed)
You were admitted to the hospital with COVID-19 and diabetic ketoacidosis. This is a potentially life-threatening complication of diabetes. We treated you with IV fluids and insulin, which brought your sugars down to more appropriate levels.  We also treated you with Maalox, Protonix, and Pepcid to help with your upper abdominal pain. Your abdominal pain is from duodenitis, which is the medical term for inflammation in your small intestine. You likely developed this due to COVID-19, but it may be helpful to follow up with a GI doctor if your symptoms persist long term. You should discuss this with your primary care physician. Avoid medications such as Ibuprofen, Aleve, and Naproxen as these can make your symptoms worse.  During your hospitalization you had many tests including basic blood work, chest x-ray, EKG, CT scans of your chest and abdomen, and an ultrasound of your abdomen.  Your COVID symptoms began on August 20th. According to [facility] guidelines for those with severe infection (you fit this category because you were hospitalized), you should quarantine until September 10th (21 days from onset of symptoms). It is not necessary to isolate from your wife and child, as they have recently been sick with COVID and should have antibodies.  The best thing you can do to protect yourself and others is to get vaccinated against COVID-19. You can do this as soon as your quarantine is over.  Please follow up with your primary care doctor for long term management of your diabetes.

## 2020-01-11 NOTE — Discharge Summary (Addendum)
[facility] Discharge Summary  Patient name: Guy Gonzalez record number: [id number] Date of birth: 03/04/1971 Age: 49 y.o. Gender: male Date of Admission: 12/31/2019  Date of Discharge: 01/11/2020 Admitting Physician: Eulis Foster, MD  Primary Care Provider: Patient, No Pcp Per Consultants: None  Indication for Hospitalization: DKA, COVID-19  Discharge Diagnoses/Problem List:  Diabetic Ketoacidosis T2DM COVID-19 Duodenitis  Disposition: Home w/home health services  Discharge Condition: Improved, stable  Discharge Exam:  General: alert, well-appearing, NAD HEENT: [place]/AT, moist mucous membranes, PERRL Cardiovascular: RRR, normal S1/S2 without m/r/g Respiratory: normal work of breathing, lungs CTAB Abdomen: +BS, soft, nondistended, mild epigastric tenderness Extremities: no peripheral edema, 2+ distal pulses  Brief Hospital Course:  Guy Gonzalez is a 49 y.o. male presenting with COVID-19 infection and hyperglycemia in setting of DKA. PMH is significant for T2DM, GERD, obesity.  DKA, resolved  T2DM Patient presented in DKA and was placed on DKA protocol. A/G gap closed x2 and BHB downtrended to within normal limits. Patient transitioned off insulin gtt., potassium, and IV D5 LR to mIVF NS and Lantus with sensitive sliding scale.  Patient appeared to have significant insulin resistance and was subsequently changed to a moderate sliding scale. Insulin regimen was adjusted until good sugar control was achieved with 20U BID. Patient was also placed on linagliptin initially as his A1c was 14.6% and he was COVID positive.  Patient was visited by diabetes coordinator as he had never taken insulin in the past and required education prior to discharge. He was discharged home on an insulin regimen of 30U lantus daily.  COVID-19 positive, unvaccinated Incidental finding. Patient did not have O2 requirement at presentation or during  hospitalization. Patient was not placed on Decadron or remdesivir and he remained stable on room air throughout his admission.   GERD, Duodenitis Patient had significant acid reflux at presentation with belching.  Patient continued to have epigastric pain despite Sucralfate, Maalox/Mylanta, Protonix and Famotidine. CT abdomen pelvis resulted no significant process in the abdomen to explain pain. However it did find colonic diverticulosis and 1.3 right renal cyst. GI curbsided for recommendations - RUQ ultrasound negative and upper GI series revealed duodenitis. He was maintained on Sulcrafate, Maalox/Mylanta, Protonix, and Pepcid throughout his admission with improvement in his symptoms. He was discharged with prescriptions of these and recommended to follow up with GI outpatient if recurs.   Chest pain At presentation patient c/o chest pain and was tachycardic. Trop 12>8 and EKG showed sinus tachycardia. Patient's chest pain resolved with DKA resolution, but then subsequently recurred on 9/4. CTPA obtained at that time was negative and trops were negative x2. Repeat EKG and labs were unremarkable. Suspect intermittent chest pain was related to COVID-19.  Issues for Follow Up:  Consider GI referral if patient's epigastric pain persists long term Can re-start Metformin once abdominal pain resolves Avoid NSAIDs  Significant Procedures: None  Significant Labs and Imaging:  Recent Labs  Lab 01/07/20 1131  WBC 7.9  HGB 13.4  HCT 41.3  PLT 309   Recent Labs  Lab 01/07/20 1131  NA 137  K 4.0  CL 102  CO2 25  GLUCOSE 293*  BUN 7  CREATININE 0.93  CALCIUM 8.6*  ALKPHOS 64  AST 28  ALT 31  ALBUMIN 2.4*    DG Chest 2 View Result Date: 12/16/2019 IMPRESSION: No acute cardiopulmonary process.  CT ANGIO CHEST PE W OR WO CONTRAST Result Date: 01/07/2020 IMPRESSION: 1. Limited assessment for pulmonary embolus given contrast  bolus timing and breathing motion artifact. Allowing for  limitations, no pulmonary embolus to the segmental level. 2. Patchy ground-glass opacities, right greater than left, involving the mid lower lung zones, consistent with COVID-19 pneumonia. 3. Mild central bronchial thickening.  CT ABDOMEN PELVIS W CONTRAST Result Date: 01/02/2020 IMPRESSION: 1. Mild to moderate severity patchy multifocal bilateral lower lobe infiltrates. 2. Colonic diverticulosis. 3. 1.3 cm right renal cyst.   DG CHEST PORT 1 VIEW Result Date: 01/07/2020 IMPRESSION: 1. Subtle hazy airspace lung opacities have developed in the peripheral lower lungs, greater on the right, consistent with multifocal pneumonia.   DG Chest Portable 1 View Result Date: 12/31/2019 IMPRESSION: 1. A linear metallic foreign body overlies the lower right neck and upper chest, likely outside the patient. Recommend clinical correlation. 2. No pulmonary infiltrate/pneumonia identified. No other abnormalities.    DG UGI W SINGLE CM (SOL OR THIN BA) Result Date: 01/04/2020 IMPRESSION: Mucosal edema in the second portion duodenum compatible with duodenitis. Negative for ulcer.   US Abdomen Limited RUQ Result Date: 01/03/2020 IMPRESSION: Unremarkable right upper quadrant ultrasound.    Results/Tests Pending at Time of Discharge: None  Discharge Medications:  Allergies as of 01/11/2020       Reactions   Penicillins Swelling   Cephalexin Rash        Medication List     TAKE these medications    acetaminophen 325 MG tablet Commonly known as: TYLENOL Take 2 tablets (650 mg total) by mouth every 6 (six) hours as needed for moderate pain.   alum & mag hydroxide-simeth 200-200-20 MG/5ML suspension Commonly known as: MAALOX/MYLANTA Take 15 mLs by mouth every 4 (four) hours as needed for indigestion or heartburn.   blood glucose meter kit and supplies Dispense based on patient and insurance preference. Use up to four times daily as directed. (FOR ICD-10 E10.9, E11.9).   famotidine 20 MG  tablet Commonly known as: PEPCID Take 1 tablet (20 mg total) by mouth 2 (two) times daily.   Insulin Pen Needle 32G X 4 MM Misc 1 each by Does not apply route in the morning, at noon, in the evening, and at bedtime.   Lantus SoloStar 100 UNIT/ML Solostar Pen Generic drug: insulin glargine Inject 30 Units into the skin daily with breakfast. Start taking on: January 12, 2020   pantoprazole 40 MG tablet Commonly known as: PROTONIX Take 1 tablet (40 mg total) by mouth daily. Start taking on: January 12, 2020   sucralfate 1 GM/10ML suspension Commonly known as: CARAFATE Take 10 mLs (1 g total) by mouth 4 (four) times daily -  with meals and at bedtime.               Durable Medical Equipment  (From admission, onward)           Start     Ordered   01/02/20 1358  For home use only DME Walker rolling  Once       Question Answer Comment  Walker: With [place] Wheels   Patient needs a walker to treat with the following condition Weakness      01/02/20 1358            Discharge Instructions: Please refer to Patient Instructions section of EMR for full details.  Patient was counseled important signs and symptoms that should prompt return to medical care, changes in medications, dietary instructions, activity restrictions, and follow up appointments.   Follow-Up Appointments:  Follow-up Information     [facility]. Schedule an appointment as soon as possible for a visit.   Contact information: [address] [place] [address] [phone]        Primary Care Physician Follow up.   Why: Contact: -[id number], [phone]. M, W & F: 8:00am - 5:00pm -[place], [place] [address] [phone] [facility], [place] [address] [place], [place] Contact information: Call one of the following for a PCP        Care, [facility] Follow up.   Specialty: [place] Why: You will be provided with home health services through [facility].  They will call you in the next 24-48 hours to set up therapy times. Contact information: [place] [address] [phone]                 Alcus Dad, MD 01/11/2020, 4:18 PM PGY-1, [place], DO [place], PGY3 01/12/2020 8:23 AM

## 2020-01-11 NOTE — TOC Transition Note (Addendum)
Transition of Care [facility]) - CM/SW Discharge Note   Patient Details  Name: YOSEPH HAILE MRN: [id number] Date of Birth: Apr 27, 1971  [facility]) CM/SW Contact:  Janae Bridgeman, RN Phone Number: 01/11/2020, 3:23 PM   Clinical Narrative:     Patient is to be discharge this evening to the care of his wife at home.  The patient's wife plans to pick him up by car to transport him home.  All medications are being delivered to the patient's room including glucometer and all supplies.  The patient has been set up with follow up appointments after his admission to the hospital and is noted in the discharge instruction.  Excuse note was given to the patient to excuse him from work until his follow up with the PCP.    Barriers to Discharge:  (Obtaining new insurance card from the patient)   Patient Goals and CMS Choice Patient states their goals for this hospitalization and ongoing recovery are:: Plans to discharge to SNF facility. CMS Medicare.gov Compare Post Acute Care list provided to:: Patient Choice offered to / list presented to : Patient  Discharge Placement                       Discharge Plan and Services   Discharge Planning Services: CM Consult Post Acute Care Choice: Skilled Nursing Facility          DME Arranged: Dan Humphreys rolling DME Agency: [employer] Date DME Agency Contacted: 01/02/20 Time DME Agency Contacted: 1606 Representative spoke with at DME Agency: Velna Hatchet HH Arranged: PT, OT [facility] Agency: [facility] Date [facility] Agency Contacted: 01/02/20 Time HH Agency Contacted: 1607 Representative spoke with at [facility] Agency: Lorenza Chick  Social Determinants of Health (SDOH) Interventions     Readmission Risk Interventions No flowsheet data found.

## 2020-01-11 NOTE — Progress Notes (Signed)
Family Medicine Teaching Service Daily Progress Note Intern Pager: [phone]  Patient name: Guy Gonzalez Medical record number: [id number] Date of birth: 04/27/1971 Age: 49 y.o. Gender: male  Primary Care Provider: Patient, No Pcp Per Consultants:  Code Status: FULL  Pt Overview and Major Events to Date:  8/28: admitted for DKA, COVID-19 9/1: upper GI series showed duodenitis 9/4: c/o chest pain- CT PE negative, troponin negative  Assessment and Plan:  Guy Gonzalez is a 49 y/o M who presented with DKA and COVID-19,also found to have duodenitis. PMH significant for T2DM, GERD, obesity.  T2DM  DKA resolved Fasting glucose132 this morning after20u lantus BID yesterday. Required10uSAI yesterday.  -Lantus 20mg  BID -Moderate sliding scale -CBG qAC and qhs -Consider initiation of statin and ARB on discharge -Needs PCP follow up on discharge for long term diabetes management -Ongoing diabetes education  COVID-19, stable on room air Stable.Patient initially tested positive on 8/20 (19days ago), with repeat COVID test positive on admission (8/28, 11 days ago). Has not required supplemental oxygen at any point. CT PEon 9/4 demonstrated patchy ground-glass opacities in mid-lower lung zones R>L. -Airborne precautions. -Continue to monitor -Supportive care as needed  Duodenitis Stable, unchanged.Most likely secondary to infection with COVID-19 and expect resolution with time. -Maalox/Mylanta q4h prn -Pepcid 20mg  BID -Protonix 40mg  daily -Carafate 1g qAC and qhs -Miralax 17g BID  FEN/GI: Carb modified diet PPx: Lovenox  Disposition: medically stable for discharge home today  Subjective:  Patient feels well overall today. Is amenable to going home this afternoon after working with physical therapy. Requesting very clear discharge instructions regarding quarantine requirements and return to work guidelines.  Objective: Temp:  [98.1 F (36.7 C)-99 F (37.2 C)] 98.7  F (37.1 C) (09/08 0515) Pulse Rate:  [86] 86 (09/07 1204) Resp:  [15-18] 15 (09/08 0515) BP: (130-145)/(87-105) 145/93 (09/08 0515) SpO2:  [94 %-97 %] 94 % (09/08 0515) Physical Exam: General: alert, well-appearing, NAD Cardiovascular: RRR, normal S1/S2 without m/r/g Respiratory: normal work of breathing, lungs CTAB Abdomen: soft, nondistended, mild epigastric tenderness Extremities: no peripheral edema, 2+ distal pulses  Laboratory: Recent Labs  Lab 01/07/20 1131  WBC 7.9  HGB 13.4  HCT 41.3  PLT 309   Recent Labs  Lab 01/07/20 1131  NA 137  K 4.0  CL 102  CO2 25  BUN 7  CREATININE 0.93  CALCIUM 8.6*  PROT 5.8*  BILITOT 0.5  ALKPHOS 64  ALT 31  AST 28  GLUCOSE 293*    Imaging/Diagnostic Tests: No new imaging or diagnostic tests.   12-21-1999, MD 01/11/2020, 9:32 AM PGY-1, [facility] Family Medicine FPTS Intern pager: [phone], text pages welcome

## 2020-01-11 NOTE — Progress Notes (Signed)
Occupational Therapy Treatment Patient Details Name: Guy Gonzalez MRN: [id number] DOB: 1971/01/01 Today's Date: 01/11/2020    History of present illness Pt is a 49 y.o. male admitted 12/31/19 with Covid and hyperglycemia in the setting of DKA. Abdominal/pelvic with mild to moderate severity patchy multifocal bilateral lobe infiltrates; colonic diverticulosis and a renal cyst. PMH includes obesity, DM, GERD.   OT comments  Pt seen for follow up OT session to focus on d/c home. Met pt up in bathroom finishing toileting tasks on arrival. Pt able to complete this at supervision level with RW. Pt then completed UB/LB dressing in preparation for home at set up- min guard level of assist. Reviewed ECS strategies and safe reintegration into routines at home. Pt had questions about COVID-19 disease process, reinforced safe quarantining and recovery. Will continue to follow acutely, but anticipate pt to d/c this date.    Follow Up Recommendations  Home health OT;Supervision - Intermittent    Equipment Recommendations  3 in 1 bedside commode (rollator)    Recommendations for Other Services      Precautions / Restrictions Precautions Precautions: Fall Restrictions Weight Bearing Restrictions: No       Mobility Bed Mobility               General bed mobility comments: up in bathroom on OT arrival  Transfers Overall transfer level: Needs assistance Equipment used: Rolling walker (2 wheeled) Transfers: Sit to/from Stand Sit to Stand: Supervision         General transfer comment: Reliant on momentum to power into standing from recliner to RW, supervision for safety; heavy reliance on UE support    Balance Overall balance assessment: Needs assistance Sitting-balance support: No upper extremity supported;Feet supported Sitting balance-Leahy Scale: Good     Standing balance support: No upper extremity supported;During functional activity;Bilateral upper extremity  supported Standing balance-Leahy Scale: Fair Standing balance comment: Can static stand without UE support; dynamic stability improved with BUE support                           ADL either performed or assessed with clinical judgement   ADL Overall ADL's : Needs assistance/impaired     Grooming: Set up;Standing   Upper Body Bathing: Set up;Sitting   Lower Body Bathing: Min guard;Sit to/from stand   Upper Body Dressing : Set up;Sitting Upper Body Dressing Details (indicate cue type and reason): donned own shirt and doffed hospital gown in prep for d/c Lower Body Dressing: Min guard;Sit to/from stand Lower Body Dressing Details (indicate cue type and reason): donned boxer briefs and shorts, as well as socks and shoes for d/c home. Pt able to complete this in sit <> stand format without external assist Toilet Transfer: Supervision/safety;Ambulation Toilet Transfer Details (indicate cue type and reason): in and out of bathroom in room         Functional mobility during ADLs: Supervision/safety General ADL Comments: pt showing improvement with activity tolerance and ADL engagement     Vision Patient Visual Report: No change from baseline     Perception     Praxis      Cognition Arousal/Alertness: Awake/alert Behavior During Therapy: WFL for tasks assessed/performed Overall Cognitive Status: Impaired/Different from baseline                           Safety/Judgement: Decreased awareness of safety;Decreased awareness of deficits     General Comments: needing  reinforcement on safety and current physical limits; as well as understanding health education about COVID disease process        Exercises Other Exercises Other Exercises: IS x10 - good technique, pulling ~2073m   Shoulder Instructions       General Comments      Pertinent Vitals/ Pain       Pain Assessment: No/denies pain Faces Pain Scale: Hurts a little bit Pain Location: Bilateral  feet Pain Descriptors / Indicators: Heaviness Pain Intervention(s): Monitored during session  Home Living                                          Prior Functioning/Environment              Frequency  Min 2X/week        Progress Toward Goals  OT Goals(current goals can now be found in the care plan section)  Progress towards OT goals: Progressing toward goals  Acute Rehab OT Goals Patient Stated Goal: home OT Goal Formulation: With patient Time For Goal Achievement: 01/16/20 Potential to Achieve Goals: Good  Plan Discharge plan needs to be updated    Co-evaluation                 AM-PAC OT "6 Clicks" Daily Activity     Outcome Measure   Help from another person eating meals?: None Help from another person taking care of personal grooming?: A Little Help from another person toileting, which includes using toliet, bedpan, or urinal?: A Little Help from another person bathing (including washing, rinsing, drying)?: A Little Help from another person to put on and taking off regular upper body clothing?: A Little Help from another person to put on and taking off regular lower body clothing?: A Little 6 Click Score: 19    End of Session    OT Visit Diagnosis: Unsteadiness on feet (R26.81);Muscle weakness (generalized) (M62.81);Other symptoms and signs involving cognitive function   Activity Tolerance Patient tolerated treatment well   Patient Left in chair;with call bell/phone within reach   Nurse Communication Mobility status        Time: 1539-1600 OT Time Calculation (min): 21 min  Charges: OT General Charges $OT Visit: 1 Visit OT Treatments $Self Care/Home Management : 8-22 mins  KZenovia Jarred MSOT, OTR/L ACajah's MountainMCaromont Specialty SurgeryOffice Number: [phone]: [phone] KZenovia Jarred9/12/2019, 6:13 PM

## 2020-01-11 NOTE — Progress Notes (Addendum)
Physical Therapy Treatment Patient Details Name: Guy Gonzalez MRN: [id number] DOB: 16-Apr-1971 Today's Date: 01/11/2020    History of Present Illness Pt is a 49 y.o. male admitted 12/31/19 with Covid and hyperglycemia in the setting of DKA. Abdominal/pelvic with mild to moderate severity patchy multifocal bilateral lobe infiltrates; colonic diverticulosis and a renal cyst. PMH includes obesity, DM, GERD.   PT Comments    Pt progressing with mobility. Planning to d/c home this evening with wife's assist. Remains limited by decreased activity tolerance and fatigue; continues to c/o chest tightness with attempts at deep breathing. Reviewed educ re: current condition, activity recommendations, fall risk reduction, IS use, and importance of frequent mobility. Recommend follow-up with HHPT services to maximize functional mobility and independence.    Follow Up Recommendations  Home health PT;Supervision for mobility/OOB (declined SNF)     Equipment Recommendations   (RW delivered)    Recommendations for Other Services       Precautions / Restrictions Precautions Precautions: Fall Restrictions Weight Bearing Restrictions: No    Mobility  Bed Mobility               General bed mobility comments: Received sitting EOB  Transfers Overall transfer level: Needs assistance Equipment used: Rolling walker (2 wheeled) Transfers: Sit to/from Stand Sit to Stand: Supervision         General transfer comment: Reliant on momentum to power into standing from recliner to RW, supervision for safety; heavy reliance on UE support  Ambulation/Gait Ambulation/Gait assistance: Supervision Gait Distance (Feet): 64 Feet Assistive device: Rolling walker (2 wheeled) Gait Pattern/deviations: Step-through pattern;Decreased stride length;Trunk flexed Gait velocity: Decreased Gait velocity interpretation: <1.31 ft/sec, indicative of household ambulator General Gait Details: Slow, labored gait with  RW and supervision for safety; walked 32' + 32' with prolonged seated rest to recover secondary to fatigue and c/o "chest tightness." Intermittent standing rest breaks secondary to this as well. SpO2 94-98% on RA   Stairs             Wheelchair Mobility    Modified Rankin (Stroke Patients Only)       Balance                                            Cognition Arousal/Alertness: Awake/alert Behavior During Therapy: WFL for tasks assessed/performed Overall Cognitive Status: Within Functional Limits for tasks assessed                                        Exercises Other Exercises Other Exercises: IS x10 - good technique, pulling ~2059mL    General Comments        Pertinent Vitals/Pain Pain Assessment: Faces Faces Pain Scale: Hurts a little bit Pain Location: Bilateral feet Pain Descriptors / Indicators: Heaviness Pain Intervention(s): Monitored during session    Home Living                      Prior Function            PT Goals (current goals can now be found in the care plan section) Progress towards PT goals: Progressing toward goals    Frequency    Min 3X/week      PT Plan Discharge plan needs to be updated  Co-evaluation              AM-PAC PT "6 Clicks" Mobility   Outcome Measure  Help needed turning from your back to your side while in a flat bed without using bedrails?: None Help needed moving from lying on your back to sitting on the side of a flat bed without using bedrails?: None Help needed moving to and from a bed to a chair (including a wheelchair)?: None Help needed standing up from a chair using your arms (e.g., wheelchair or bedside chair)?: None Help needed to walk in hospital room?: None Help needed climbing 3-5 steps with a railing? : A Little 6 Click Score: 23    End of Session   Activity Tolerance: Patient tolerated treatment well Patient left: in chair;with call  bell/phone within reach Nurse Communication: Mobility status PT Visit Diagnosis: Unsteadiness on feet (R26.81);Other abnormalities of gait and mobility (R26.89);Muscle weakness (generalized) (M62.81)     Time: 1410-1441 PT Time Calculation (min) (ACUTE ONLY): 31 min  Charges:  $Gait Training: 8-22 mins $Therapeutic Exercise: 8-22 mins                    Ina Homes, PT, DPT Acute Rehabilitation Services  Pager [phone] Office [phone]  Malachy Chamber 01/11/2020, 4:45 PM

## 2020-01-11 NOTE — Progress Notes (Signed)
Pt given discharge instructions and gone over with him. He verbalized understanding. Work note given to pt. Pt medications brought up from [facility]. Given to pt. Pt had walker to take home with him. Went over CBG machine and supplies as well as insulin pen and supplies. Met wife downstairs and showed her how to use the insulin pen. Given all information on follow up appointments and diabetes education. All questions answered.

## 2020-01-23 ENCOUNTER — Telehealth: Payer: Self-pay | Admitting: General Practice

## 2020-01-23 NOTE — Telephone Encounter (Signed)
Patient is not a patient at [facility], but patient calls saying he saw Dr. Anner Crete in the hospital and he has already spoke to her about writing him a doctor's note for his work.   Please call patient back at [phone] to discuss this.

## 2020-01-31 ENCOUNTER — Telehealth: Payer: Self-pay | Admitting: General Practice

## 2020-01-31 NOTE — Telephone Encounter (Signed)
Patient is calling and would like Dr. Anner Crete to call him concerning fmla paper work. I informed patient she would most likely not be able to do so because he is not a patient of our office and only saw her in the hospital.   He said he has discussed this with Dr. Anner Crete and she told him to call him if he needs anything.   The best call back number [phone].

## 2020-02-03 NOTE — Telephone Encounter (Signed)
Patient is calling again to speak to doctor Wells. Patient states he didn't get his pay check today because papers are not filled out. Thanks

## 2020-02-04 MED FILL — LANTUS SOLOSTAR 100 UNITS/M: 100 | 30 days supply | Qty: 9 | Fill #0

## 2020-02-06 NOTE — Telephone Encounter (Signed)
Spoke with patient regarding multiple calls to request work notes. 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 Also explained to patient that there is no reason based on his recent hospitalization that he cannot return to work. I re-iterated to patient that he will need to establish with a PCP.

## 2020-05-16 ENCOUNTER — Other Ambulatory Visit: Payer: Self-pay | Admitting: Family Medicine

## 2020-10-19 IMAGING — CT CT ABD-PELV W/ CM
2 of 5 series · 16 of 46 positions shown, 18 images · IV contrast (APPLIED)
Comparison: None.

CLINICAL DATA: Abdominal pain.

EXAM:
CT ABDOMEN AND PELVIS WITH CONTRAST
TECHNIQUE: Multidetector CT imaging of the abdomen and pelvis was performed
using the standard protocol following bolus administration of
intravenous contrast.
CONTRAST:  100mL OMNIPAQUE IOHEXOL 300 MG/ML  SOLN

[Series 3: abd/ pelvis 5.0 i30f 2 · axial · 0.81mm/px · z∈[+884,+1374]mm · 13 of 110 slices shown, 15 images]
[im 6/110  soft-tissue]
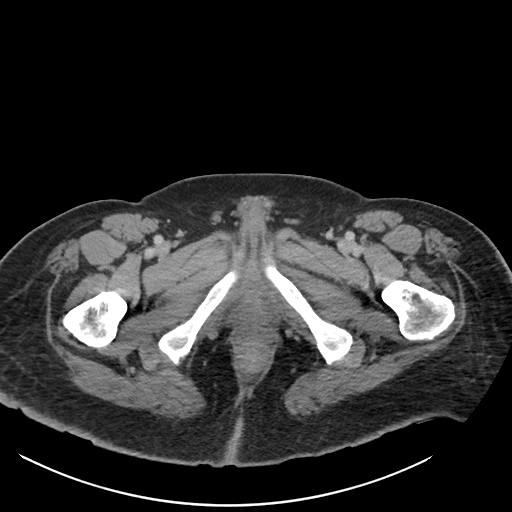
[im 6/110  bone]
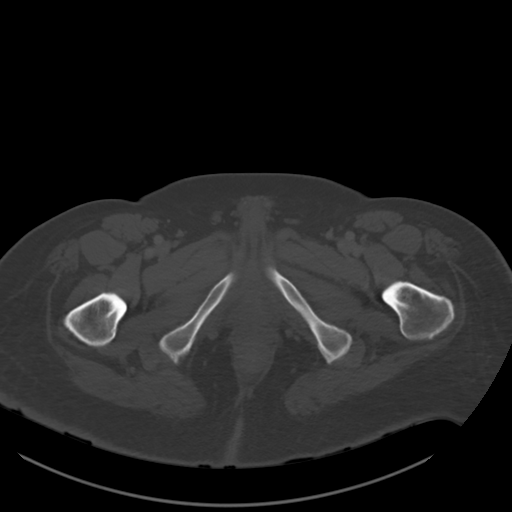
[im 18/110  soft-tissue]
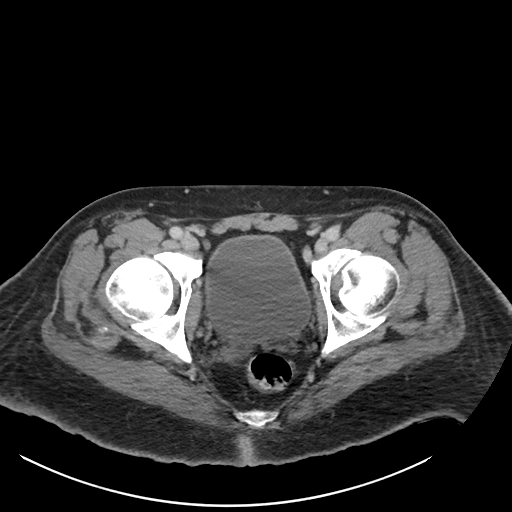
[im 23/110  soft-tissue]
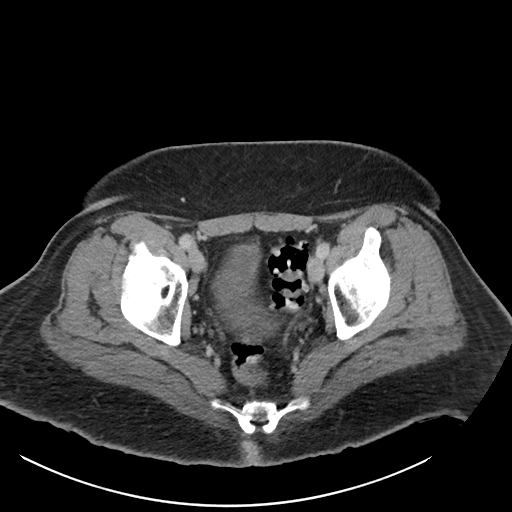
[im 29/110  soft-tissue]
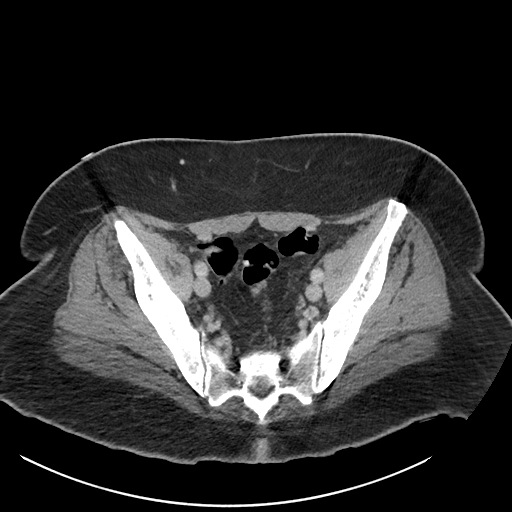
[im 41/110  soft-tissue]
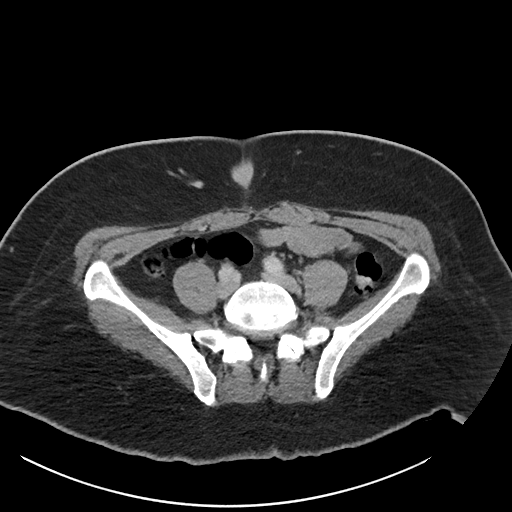
[im 46/110  soft-tissue]
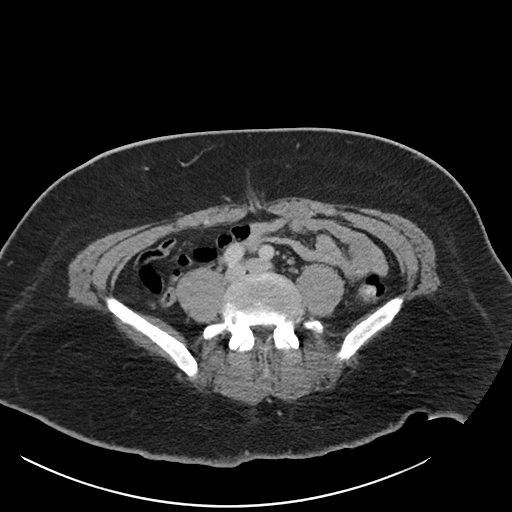
[im 58/110  soft-tissue]
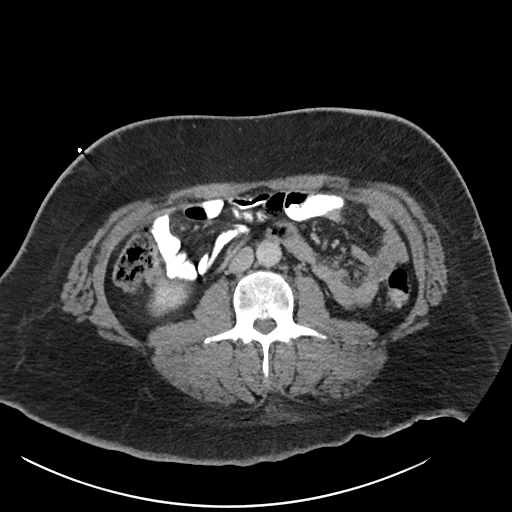
[im 64/110  soft-tissue]
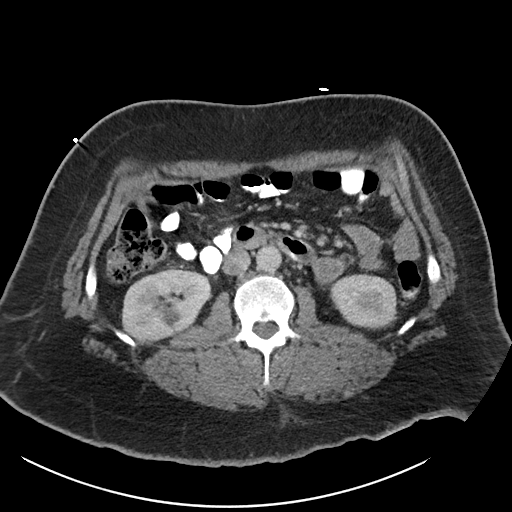
[im 69/110  soft-tissue]
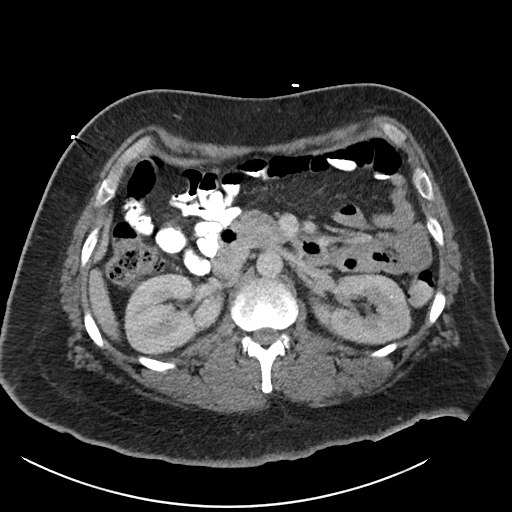
[im 69/110  bone]
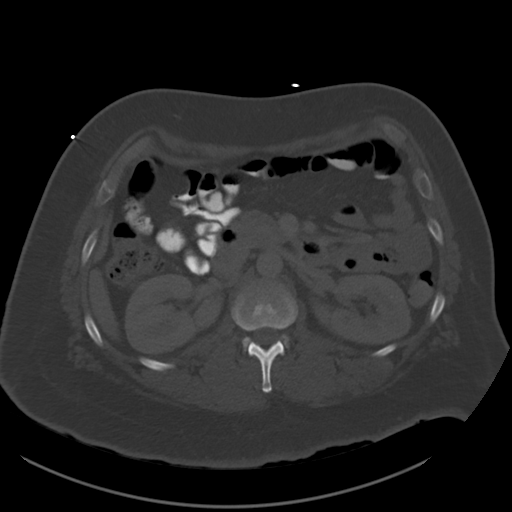
[im 81/110  soft-tissue]
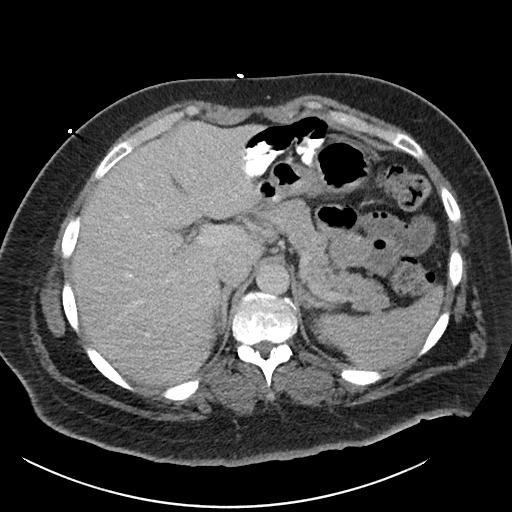
[im 87/110  soft-tissue]
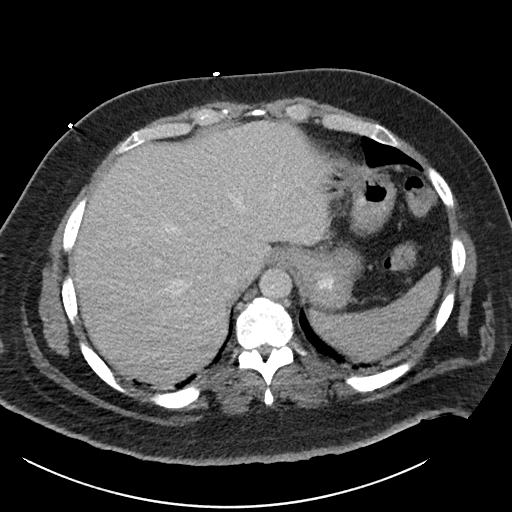
[im 92/110  soft-tissue]
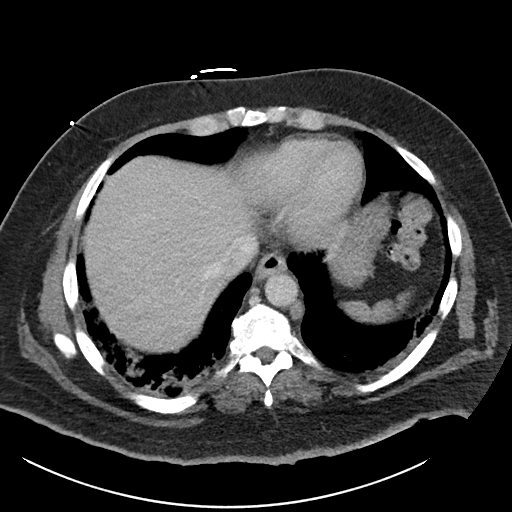
[im 104/110  soft-tissue]
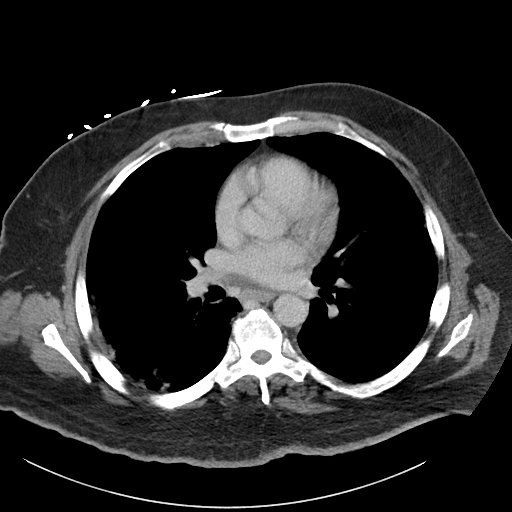

[Series 5: coronal soft tissue · coronal · 0.89mm/px · 3 of 101 slices shown]
[im 34/101  soft-tissue]
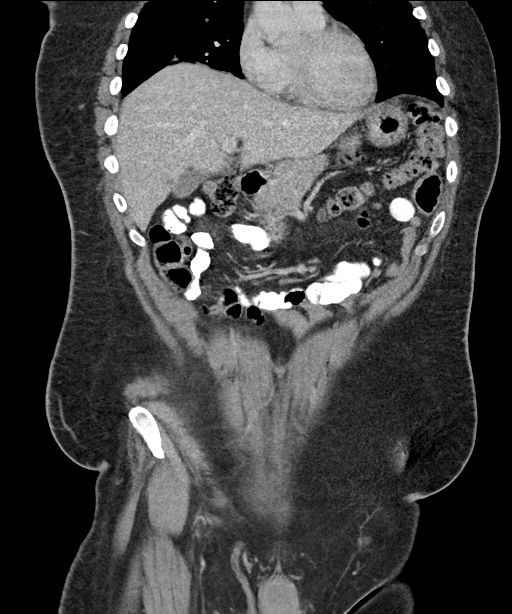
[im 45/101  soft-tissue]
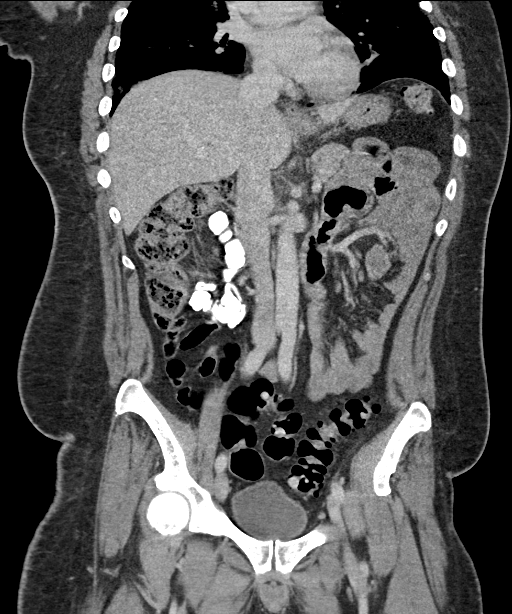
[im 56/101  soft-tissue]
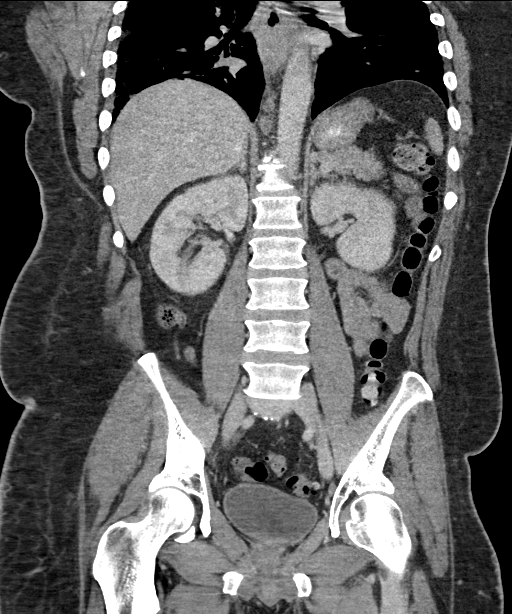

[16 of 46 positions shown; findings below may reference images not displayed]

FINDINGS: Lower chest: Mild to moderate severity patchy multifocal infiltrates
are seen along the periphery of the bilateral lower lobes, right
greater than left.

Hepatobiliary: No focal liver abnormality is seen. No gallstones,
gallbladder wall thickening, or biliary dilatation.

Pancreas: Unremarkable. No pancreatic ductal dilatation or
surrounding inflammatory changes.

Spleen: Normal in size without focal abnormality.

Adrenals/Urinary Tract: Adrenal glands are unremarkable. Kidneys are
normal, without renal calculi or hydronephrosis. A 1.3 cm cyst is
seen within the posteromedial aspect of the mid right kidney.
Bladder is unremarkable.

Stomach/Bowel: Stomach is within normal limits. Appendix appears
normal. No evidence of bowel dilatation. Noninflamed diverticula are
seen within the descending and sigmoid colon.

Vascular/Lymphatic: No significant vascular findings are present. No
enlarged abdominal or pelvic lymph nodes.

Reproductive: Prostate is unremarkable.

Other: No abdominal wall hernia or abnormality. No abdominopelvic
ascites.

Musculoskeletal: No acute or significant osseous findings.
IMPRESSION: 1. Mild to moderate severity patchy multifocal bilateral lower lobe
infiltrates.
2. Colonic diverticulosis.
3. 1.3 cm right renal cyst.

## 2020-11-10 ENCOUNTER — Encounter (HOSPITAL_COMMUNITY): Payer: Self-pay

## 2020-11-10 ENCOUNTER — Ambulatory Visit (HOSPITAL_COMMUNITY)
Admission: EM | Admit: 2020-11-10 | Discharge: 2020-11-10 | Disposition: A | Discharge status: Home / Self Care | Payer: 59 | Attending: Medical Oncology | Admitting: Medical Oncology

## 2020-11-10 DIAGNOSIS — T7840XA Allergy, unspecified, initial encounter: Secondary | ICD-10-CM

## 2020-11-10 HISTORY — DX: Polyneuropathy, unspecified: G62.9

## 2020-11-10 MED ORDER — PREDNISONE 10 MG PO TABS: ORAL_TABLET | ORAL | 0 refills | Status: DC

## 2020-11-10 MED ORDER — EPINEPHRINE 0.3 MG/0.3ML IJ SOAJ: 0.3000 mg | INTRAMUSCULAR | 0 refills | Status: DC | PRN

## 2020-11-10 NOTE — ED Triage Notes (Signed)
Pt in with c/o possible allergic reaction that started on Monday after he tried new vegan food on Sunday  Pt states he woke up with throat tightness, and bumps on his lips and eyelid  Pt took benadryl which relieved throat issues  Denies any worsening sob from his baseline

## 2020-11-10 NOTE — ED Provider Notes (Signed)
[facility]    CSN: [id number] Arrival date & time: 11/10/20  1241      History   Chief Complaint Chief Complaint  Patient presents with   Allergic Reaction    HPI Guy Gonzalez is a 50 y.o. male.   HPI  Allergic reaction: Patient reports that on Sunday he ate a new vegan food.  He reports that shortly after he noticed some mild tongue swelling, and a bumpy rash around his lips and eyes.  He also had some hives.  He has been taking Benadryl for symptoms with improvement of symptoms however he still has some of the rash of his eyelids and lips.  He has been using over-the-counter topical medications to try to see if this would heal it sooner but has not much improvement.  He denies any current chest pain, shortness of breath or trouble swallowing.  Of note he has a history of DM2 which is well controlled with his last A1C being 5.8%.   Past Medical History:  Diagnosis Date   Diabetes mellitus without complication ([facility])    Neuropathy     Patient Active Problem List   Diagnosis Date Noted   Duodenitis 01/05/2020   Abdominal pain    Acute epigastric pain    Type 2 diabetes mellitus with hyperglycemia, without long-term current use of insulin (HCC)    Dysphonia    Gastroesophageal reflux disease without esophagitis    COVID-19    DKA (diabetic ketoacidoses) 12/31/2019   HYPERGLYCEMIA 11/13/2008   OBESITY 04/06/2008   Allergic rhinitis, cause unspecified 04/06/2008   ELEVATED BLOOD PRESSURE WITHOUT DIAGNOSIS OF HYPERTENSION 04/06/2008    History reviewed. No pertinent surgical history.     Home Medications    Prior to Admission medications   Medication Sig Start Date End Date Taking? Authorizing Provider  acetaminophen (TYLENOL) 325 MG tablet Take 2 tablets (650 mg total) by mouth every 6 (six) hours as needed for moderate pain. 01/11/20   Alcus Dad, MD  alum & mag hydroxide-simeth (MAALOX/MYLANTA) 200-200-20 MG/5ML suspension Take 15 mLs by mouth every  4 (four) hours as needed for indigestion or heartburn. 01/11/20   Alcus Dad, MD  blood glucose meter kit and supplies Dispense based on patient and insurance preference. Use up to four times daily as directed. (FOR ICD-10 E10.9, E11.9). 01/11/20   Alcus Dad, MD  famotidine (PEPCID) 20 MG tablet Take 1 tablet (20 mg total) by mouth 2 (two) times daily. 01/11/20   Alcus Dad, MD  insulin glargine (LANTUS) 100 UNIT/ML Solostar Pen INJECT 30 UNITS INTO THE SKIN DAILY WITH BREAKFAST. 01/11/20 01/10/21  Alcus Dad, MD  Insulin Pen Needle 32G X 4 MM MISC 1 each by Does not apply route in the morning, at noon, in the evening, and at bedtime. 01/11/20   Alcus Dad, MD  pantoprazole (PROTONIX) 40 MG tablet Take 1 tablet (40 mg total) by mouth daily. 01/12/20   Alcus Dad, MD  sucralfate (CARAFATE) 1 GM/10ML suspension Take 10 mLs (1 g total) by mouth 4 (four) times daily -  with meals and at bedtime. 01/11/20   Alcus Dad, MD  hydrochlorothiazide (HYDRODIURIL) 12.5 MG tablet Take 1 tablet (12.5 mg total) by mouth daily. 10/22/17 08/14/19  Noe Gens, PA-C  ipratropium (ATROVENT) 0.06 % nasal spray Place 2 sprays into both nostrils 4 (four) times daily. 05/09/18 08/14/19  Ok Edwards, PA-C    Family History Family History  Problem Relation Age of Onset   Healthy  Mother    Diabetes Father    Heart failure Father     Social History Social History   Tobacco Use   Smoking status: Former    Pack years: 0.00    Types: Cigarettes    Quit date: 05/11/2000    Years since quitting: 20.5   Smokeless tobacco: Never  Substance Use Topics   Alcohol use: Yes    Comment: socially   Drug use: No     Allergies   Penicillins and Cephalexin   Review of Systems Review of Systems  As stated above in HPI Physical Exam Triage Vital Signs ED Triage Vitals  Enc Vitals Group     BP 11/10/20 1306 (!) 140/96     Pulse Rate 11/10/20 1306 94     Resp 11/10/20 1306 18     Temp 11/10/20 1306  98.7 F (37.1 C)     Temp Source 11/10/20 1306 Oral     SpO2 11/10/20 1306 97 %     Weight --      Height --      Head Circumference --      Peak Flow --      Pain Score 11/10/20 1307 0     Pain Loc --      Pain Edu? --      Excl. in [place]? --    No data found.  Updated Vital Signs BP (!) 140/96 (BP Location: Right Arm)   Pulse 94   Temp 98.7 F (37.1 C) (Oral)   Resp 18   SpO2 97%   Physical Exam Vitals and nursing note reviewed.  Constitutional:      General: He is not in acute distress.    Appearance: Normal appearance. He is not ill-appearing, toxic-appearing or diaphoretic.  HENT:     Head: Normocephalic and atraumatic.     Right Ear: Tympanic membrane, ear canal and external ear normal.     Left Ear: Tympanic membrane, ear canal and external ear normal.     Nose: Nose normal.     Mouth/Throat:     Mouth: Mucous membranes are moist.     Pharynx: Oropharynx is clear.     Comments: Few scattered small ulcer like lesions of superior and inferior lips on both the left and right side. No lesions in mouth. No tongue swelling or angioedema. Some skin colored papules of bilateral superior eyelids.  Cardiovascular:     Rate and Rhythm: Normal rate and regular rhythm.  Pulmonary:     Effort: Pulmonary effort is normal.     Breath sounds: Normal breath sounds.  Musculoskeletal:     Cervical back: Normal range of motion and neck supple.  Lymphadenopathy:     Cervical: No cervical adenopathy.  Skin:    Findings: Rash (mild urticaria of the right arm) present.  Neurological:     Mental Status: He is alert.     UC Treatments / Results  Labs (all labs ordered are listed, but only abnormal results are displayed) Labs Reviewed - No data to display  EKG   Radiology No results found.  Procedures Procedures (including critical care time)  Medications Ordered in UC Medications - No data to display  Initial Impression / Assessment and Plan / UC Course  I have  reviewed the triage vital signs and the nursing notes.  Pertinent labs & imaging results that were available during my care of the patient were reviewed by me and considered in my medical decision making (see  chart for details).     New.  Appears to be an allergic reaction to the food he consumed.  He will not eat that food again.  Difficult to assess what part of this chemical compound food caused his reaction.  For now sending him home with an EpiPen to use in case of severe allergic reaction.  He will continue using Benadryl as needed and medicine and Benadryl low-dose given his diabetes to help with his remaining reaction.  Discussed how to use along with common potential side effects and precautions.  Follow-up as needed. Final Clinical Impressions(s) / UC Diagnoses   Final diagnoses:  None   Discharge Instructions   None    ED Prescriptions   None    PDMP not reviewed this encounter.   Hughie Closs, [place] 11/10/20 1330

## 2021-08-18 ENCOUNTER — Encounter (HOSPITAL_COMMUNITY): Payer: Self-pay | Admitting: Emergency Medicine

## 2021-08-18 ENCOUNTER — Ambulatory Visit (HOSPITAL_COMMUNITY)
Admission: EM | Admit: 2021-08-18 | Discharge: 2021-08-18 | Disposition: A | Discharge status: Home / Self Care | Payer: Medicaid Other | Attending: Internal Medicine | Admitting: Internal Medicine

## 2021-08-18 DIAGNOSIS — T7840XA Allergy, unspecified, initial encounter: Secondary | ICD-10-CM | POA: Diagnosis not present

## 2021-08-18 DIAGNOSIS — R220 Localized swelling, mass and lump, head: Secondary | ICD-10-CM | POA: Diagnosis not present

## 2021-08-18 MED ORDER — METHYLPREDNISOLONE SODIUM SUCC 125 MG IJ SOLR
INTRAMUSCULAR | Status: AC
  Filled 2021-08-18: qty 2

## 2021-08-18 MED ORDER — FAMOTIDINE 20 MG PO TABS: 20.0000 mg | ORAL_TABLET | Freq: Every day | ORAL | 0 refills | Status: AC

## 2021-08-18 MED ORDER — EPINEPHRINE 0.3 MG/0.3ML IJ SOAJ
0.3000 mg | INTRAMUSCULAR | 1 refills | Status: AC | PRN
Start: 2021-08-18 — End: ?

## 2021-08-18 MED ORDER — METHYLPREDNISOLONE SODIUM SUCC 125 MG IJ SOLR: 80.0000 mg | Freq: Once | INTRAMUSCULAR | Status: DC

## 2021-08-18 MED ORDER — PREDNISONE 50 MG PO TABS: ORAL_TABLET | ORAL | 0 refills | Status: DC

## 2021-08-18 NOTE — ED Triage Notes (Signed)
Pt c/o facial swelling that started around 330pm yesterday. Took Benadryl and put ice on face but hasnt helped. Reports fasting so not reaction to foods. Pt reports burning and itching in face.  ?

## 2021-08-18 NOTE — Discharge Instructions (Addendum)
Go ahead and start medication once you pick it up.  As we discussed, please check your blood sugar regularly while on steroid.  I would expect her numbers to be higher.  You may require your insulin as discussed.  You will need to follow-up should blood sugars become difficult to control. ?You will also need to continue Benadryl every 6 hours as needed for swelling and itching as well as Pepcid 20 mg daily until symptoms resolve.  I will prescribe an EpiPen to use should he develop any mouth or tongue swelling or shortness of breath.  If this happens you will need to go directly to the emergency department. ?

## 2021-08-18 NOTE — ED Provider Notes (Signed)
?[facility] ? ? ? ?CSN: [id number] ?Arrival date & time: 08/18/21  1007 ? ? ?  ? ?History   ?Chief Complaint ?Chief Complaint  ?Patient presents with  ? Facial Swelling  ? ? ?HPI ?Guy Gonzalez is a 51 y.o. male.  ? ?Yesterday noticed eyes swelling shut and face swelling. Kept rubbing eyes. Was sitting outside when it happened. Does not remember getting stung though there is a darkened spot on lower lip, no known food allergens and did not eat anything new. Took benadryl yesterday and again this am, put ice packs on eyes with some improvement. Lip swelling, but denies any tongue or throat swelling, no sob. New BP medication ~month ago losartan HCT.  Patient reports good blood sugar control with last A1c being 5.7%.  States he has insulin at home to use as needed but has not had to use. ? ? ?Past Medical History:  ?Diagnosis Date  ? Diabetes mellitus without complication ([facility])   ? Neuropathy   ? ? ?Patient Active Problem List  ? Diagnosis Date Noted  ? Duodenitis 01/05/2020  ? Abdominal pain   ? Acute epigastric pain   ? Type 2 diabetes mellitus with hyperglycemia, without long-term current use of insulin ([facility])   ? Dysphonia   ? Gastroesophageal reflux disease without esophagitis   ? COVID-19   ? DKA (diabetic ketoacidoses) 12/31/2019  ? HYPERGLYCEMIA 11/13/2008  ? OBESITY 04/06/2008  ? Allergic rhinitis, cause unspecified 04/06/2008  ? ELEVATED BLOOD PRESSURE WITHOUT DIAGNOSIS OF HYPERTENSION 04/06/2008  ? ? ?History reviewed. No pertinent surgical history. ? ? ? ? ?Home Medications   ? ?Prior to Admission medications   ?Medication Sig Start Date End Date Taking? Authorizing Provider  ?EPINEPHrine 0.3 mg/0.3 mL IJ SOAJ injection Inject 0.3 mg into the muscle as needed for anaphylaxis. 08/18/21  Yes Rudolpho Sevin, NP  ?famotidine (PEPCID) 20 MG tablet Take 1 tablet (20 mg total) by mouth daily. 08/18/21 09/17/21 Yes Rudolpho Sevin, NP  ?predniSONE (DELTASONE) 50 MG tablet Take 1 tablet daily until gone  08/18/21  Yes Rudolpho Sevin, NP  ?acetaminophen (TYLENOL) 325 MG tablet Take 2 tablets (650 mg total) by mouth every 6 (six) hours as needed for moderate pain. 01/11/20   Alcus Dad, MD  ?alum & mag hydroxide-simeth (MAALOX/MYLANTA) 200-200-20 MG/5ML suspension Take 15 mLs by mouth every 4 (four) hours as needed for indigestion or heartburn. 01/11/20   Alcus Dad, MD  ?blood glucose meter kit and supplies Dispense based on patient and insurance preference. Use up to four times daily as directed. (FOR ICD-10 E10.9, E11.9). 01/11/20   Alcus Dad, MD  ?insulin glargine (LANTUS) 100 UNIT/ML Solostar Pen INJECT 30 UNITS INTO THE SKIN DAILY WITH BREAKFAST. 01/11/20 01/10/21  Alcus Dad, MD  ?Insulin Pen Needle 32G X 4 MM MISC 1 each by Does not apply route in the morning, at noon, in the evening, and at bedtime. 01/11/20   Alcus Dad, MD  ?pantoprazole (PROTONIX) 40 MG tablet Take 1 tablet (40 mg total) by mouth daily. 01/12/20   Alcus Dad, MD  ?sucralfate (CARAFATE) 1 GM/10ML suspension Take 10 mLs (1 g total) by mouth 4 (four) times daily -  with meals and at bedtime. 01/11/20   Alcus Dad, MD  ?hydrochlorothiazide (HYDRODIURIL) 12.5 MG tablet Take 1 tablet (12.5 mg total) by mouth daily. 10/22/17 08/14/19  Noe Gens, PA-C  ?ipratropium (ATROVENT) 0.06 % nasal spray Place 2 sprays into both nostrils 4 (four) times daily.  05/09/18 08/14/19  Ok Edwards, PA-C  ? ? ?Family History ?Family History  ?Problem Relation Age of Onset  ? Healthy Mother   ? Diabetes Father   ? Heart failure Father   ? ? ?Social History ?Social History  ? ?Tobacco Use  ? Smoking status: Former  ?  Types: Cigarettes  ?  Quit date: 05/11/2000  ?  Years since quitting: 21.2  ? Smokeless tobacco: Never  ?Substance Use Topics  ? Alcohol use: Yes  ?  Comment: socially  ? Drug use: No  ? ? ? ?Allergies   ?Penicillins and Cephalexin ? ? ?Review of Systems ?As stated in HPI otherwise negative ? ? ?Physical Exam ?Triage Vital Signs ?ED Triage  Vitals  ?Enc Vitals Group  ?   BP 08/18/21 1043 133/89  ?   Pulse Rate 08/18/21 1043 84  ?   Resp 08/18/21 1043 18  ?   Temp 08/18/21 1043 98.3 ?F (36.8 ?C)  ?   Temp Source 08/18/21 1043 Oral  ?   SpO2 08/18/21 1043 98 %  ?   Weight --   ?   Height --   ?   Head Circumference --   ?   Peak Flow --   ?   Pain Score 08/18/21 1042 0  ?   Pain Loc --   ?   Pain Edu? --   ?   Excl. in [place]? --   ? ?No data found. ? ?Updated Vital Signs ?BP 133/89 (BP Location: Right Arm)   Pulse 84   Temp 98.3 ?F (36.8 ?C) (Oral)   Resp 18   SpO2 98%  ? ?Visual Acuity ?Right Eye Distance:   ?Left Eye Distance:   ?Bilateral Distance:   ? ?Right Eye Near:   ?Left Eye Near:    ?Bilateral Near:    ? ?Physical Exam ?Constitutional:   ?   General: He is not in acute distress. ?   Appearance: Normal appearance. He is not ill-appearing or toxic-appearing.  ?HENT:  ?   Head: Atraumatic.  ?   Nose: Nose normal. No congestion.  ?   Mouth/Throat:  ?   Mouth: Mucous membranes are moist.  ?   Pharynx: Oropharynx is clear. No oropharyngeal exudate or posterior oropharyngeal erythema.  ?   Comments: No tongue or throat swelling, airway intact.  Mild swelling to lower lip with possible bite mark on lower lip just right of midline ?Eyes:  ?   General: No scleral icterus. ?   Extraocular Movements: Extraocular movements intact.  ?   Conjunctiva/sclera: Conjunctivae normal.  ?Cardiovascular:  ?   Rate and Rhythm: Normal rate and regular rhythm.  ?   Heart sounds: No murmur heard. ?  No friction rub. No gallop.  ?Pulmonary:  ?   Effort: Pulmonary effort is normal. No respiratory distress.  ?   Breath sounds: Normal breath sounds. No stridor. No wheezing or rales.  ?Musculoskeletal:     ?   General: Normal range of motion.  ?   Cervical back: Normal range of motion and neck supple. No tenderness.  ?Lymphadenopathy:  ?   Cervical: No cervical adenopathy.  ?Skin: ?   General: Skin is warm and dry.  ?   Findings: No rash.  ?   Comments: Mild swelling  bilateral to jawline, R>L, mild periorbital swelling without erythema  ?Neurological:  ?   General: No focal deficit present.  ?   Mental Status: He is alert and oriented  to person, place, and time.  ?Psychiatric:     ?   Mood and Affect: Mood normal.     ?   Behavior: Behavior normal.  ? ? ? ?UC Treatments / Results  ?Labs ?(all labs ordered are listed, but only abnormal results are displayed) ?Labs Reviewed - No data to display ? ?EKG ? ? ?Radiology ?No results found. ? ?Procedures ?Procedures (including critical care time) ? ?Medications Ordered in UC ?Medications - No data to display ? ? ?Initial Impression / Assessment and Plan / UC Course  ?I have reviewed the triage vital signs and the nursing notes. ? ?Pertinent labs & imaging results that were available during my care of the patient were reviewed by me and considered in my medical decision making (see chart for details). ? ?Allergic reaction ?Facial swelling ?-Onset yesterday afternoon while sitting outside.  Symptoms relieved with Benadryl and ice packs.  Small perhaps bite mark on lower lip suggestive of insect bite however cannot rule out other exposure.  Low suspicion for medication effect given pruritic nature of symptoms and efficacy of Benadryl ?-Short steroid burst, patient to monitor blood sugars at home and use previously prescribed insulin as needed should blood sugars become uncontrolled ?-Pepcid p.o. daily, Benadryl every 6 hours as needed.  Will prescribe EpiPen.  Patient given instructions on use.  ER should he develop any throat swelling or difficulty breathing ? ?Reviewed expections re: course of current medical issues. Questions answered. ?Outlined signs and symptoms indicating need for more acute intervention. ?Pt verbalized understanding. ?AVS given ? ?Final Clinical Impressions(s) / UC Diagnoses  ? ?Final diagnoses:  ?Allergic reaction, initial encounter  ?Facial swelling  ? ? ? ?Discharge Instructions   ? ?  ?Go ahead and start  medication once you pick it up.  As we discussed, please check your blood sugar regularly while on steroid.  I would expect her numbers to be higher.  You may require your insulin as discussed.  You will need to follow-up s

## 2022-03-13 ENCOUNTER — Other Ambulatory Visit: Payer: Self-pay

## 2022-03-13 ENCOUNTER — Emergency Department (HOSPITAL_COMMUNITY)
Admission: EM | Admit: 2022-03-13 | Discharge: 2022-03-14 | Discharge status: Against Medical Advice | Payer: Medicaid Other | Attending: Emergency Medicine | Admitting: Emergency Medicine

## 2022-03-13 ENCOUNTER — Encounter (HOSPITAL_COMMUNITY): Payer: Self-pay

## 2022-03-13 DIAGNOSIS — Z5321 Procedure and treatment not carried out due to patient leaving prior to being seen by health care provider: Secondary | ICD-10-CM | POA: Insufficient documentation

## 2022-03-13 DIAGNOSIS — K625 Hemorrhage of anus and rectum: Secondary | ICD-10-CM | POA: Diagnosis present

## 2022-03-13 LAB — CBC
HCT: 40.3 % (ref 39.0–52.0)
Hemoglobin: 13.8 g/dL (ref 13.0–17.0)
MCH: 32.1 pg (ref 26.0–34.0)
MCHC: 34.2 g/dL (ref 30.0–36.0)
MCV: 93.7 fL (ref 80.0–100.0)
Platelets: 252 10*3/uL (ref 150–400)
RBC: 4.3 MIL/uL (ref 4.22–5.81)
RDW: 13.1 % (ref 11.5–15.5)
WBC: 7.9 10*3/uL (ref 4.0–10.5)
nRBC: 0 % (ref 0.0–0.2)

## 2022-03-13 LAB — COMPREHENSIVE METABOLIC PANEL
ALT: 15 U/L (ref 0–44)
AST: 20 U/L (ref 15–41)
Albumin: 3.9 g/dL (ref 3.5–5.0)
Alkaline Phosphatase: 51 U/L (ref 38–126)
Anion gap: 8 (ref 5–15)
BUN: 15 mg/dL (ref 6–20)
CO2: 25 mmol/L (ref 22–32)
Calcium: 9.3 mg/dL (ref 8.9–10.3)
Chloride: 107 mmol/L (ref 98–111)
Creatinine, Ser: 0.98 mg/dL (ref 0.61–1.24)
GFR, Estimated: >60 mL/min (ref >60)
Glucose, Bld: 153 mg/dL — ABNORMAL HIGH (ref 70–99)
Potassium: 4.3 mmol/L (ref 3.5–5.1)
Sodium: 140 mmol/L (ref 135–145)
Total Bilirubin: 0.4 mg/dL (ref 0.3–1.2)
Total Protein: 7 g/dL (ref 6.5–8.1)

## 2022-03-13 NOTE — ED Provider Triage Note (Signed)
Emergency Medicine Provider Triage Evaluation Note  KRISTY CATOE , a 51 y.o. male  was evaluated in triage.  Pt complains of rectal bleeding starting this evening. Had large bowel movement with rectal pain, with blood. Started having a "bubble gut" feeling and felt blood dripping out of his rectum. Not on blood thinners.   Review of Systems  Positive: Rectal pain, rectal bleeding/blood in stool Negative: Abdominal pain, N/v  Physical Exam  BP (!) 181/111   Pulse 92   Temp 98.9 F (37.2 C) (Oral)   Resp 16   Ht 6\' 4"  (1.93 m)   Wt 123.4 kg   SpO2 96%   BMI 33.11 kg/m  Gen:   Awake, no distress   Resp:  Normal effort  MSK:   Moves extremities without difficulty  Other:    Medical Decision Making  Medically screening exam initiated at 8:57 PM.  Appropriate orders placed.  was informed that the remainder of the evaluation will be completed by another provider, this initial triage assessment does not replace that evaluation, and the importance of remaining in the ED until their evaluation is complete.     Donna Bernard, PA-C 03/13/22 2058

## 2022-03-13 NOTE — ED Triage Notes (Signed)
Pt states he had bright red blood in toilet after a large bowel movement. Pt states he then had another episode of diarrhea with blood. Pt states he feels pain in rectum. Pt has had nausea.

## 2022-03-14 ENCOUNTER — Ambulatory Visit
Admission: EM | Admit: 2022-03-14 | Discharge: 2022-03-14 | Disposition: A | Discharge status: Home / Self Care | Payer: Medicaid Other

## 2022-03-14 ENCOUNTER — Encounter: Payer: Self-pay | Admitting: Emergency Medicine

## 2022-03-14 DIAGNOSIS — I10 Essential (primary) hypertension: Secondary | ICD-10-CM

## 2022-03-14 DIAGNOSIS — K648 Other hemorrhoids: Secondary | ICD-10-CM

## 2022-03-14 DIAGNOSIS — E119 Type 2 diabetes mellitus without complications: Secondary | ICD-10-CM

## 2022-03-14 DIAGNOSIS — Z794 Long term (current) use of insulin: Secondary | ICD-10-CM

## 2022-03-14 DIAGNOSIS — K625 Hemorrhage of anus and rectum: Secondary | ICD-10-CM

## 2022-03-14 MED ORDER — DOCUSATE SODIUM 100 MG PO CAPS: 100.0000 mg | ORAL_CAPSULE | Freq: Two times a day (BID) | ORAL | 0 refills | Status: AC

## 2022-03-14 MED ORDER — LIDOCAINE (ANORECTAL) 5 % EX GEL: CUTANEOUS | 0 refills | Status: AC

## 2022-03-14 NOTE — ED Notes (Signed)
Pt left with his family. 

## 2022-03-14 NOTE — ED Provider Notes (Addendum)
[facility]  Note:  This document was prepared using Systems analyst and may include unintentional dictation errors.  MRN: [id number] DOB: 01-Mar-1971  Subjective:   Guy Gonzalez is a 51 y.o. male presenting for 1 day history of acute onset bright red blood in toilet after having a large bowel movement, has had some perianal pain on the left side. No fever, abdominal pain, pelvic pain, hematuria, dysuria, vomiting.  Denies difficulty with constipation, urinary heavy lifting or bearing down.  Patient went through the emergency room and then left without being seen after waiting for 12 hours.  Results are as below, has not had review.  No current facility-administered medications for this encounter.  Current Outpatient Medications:    gabapentin (NEURONTIN) 300 MG capsule, Take 300 mg by mouth 3 (three) times daily., Disp: , Rfl:    insulin glargine (LANTUS) 100 UNIT/ML injection, Inject 30 Units into the skin daily., Disp: , Rfl:    losartan-hydrochlorothiazide (HYZAAR) 50-12.5 MG tablet, Take 1 tablet by mouth daily., Disp: , Rfl:    metFORMIN (GLUCOPHAGE) 1000 MG tablet, Take by mouth., Disp: , Rfl:    acetaminophen (TYLENOL) 325 MG tablet, Take 2 tablets (650 mg total) by mouth every 6 (six) hours as needed for moderate pain., Disp: , Rfl:    alum & mag hydroxide-simeth (MAALOX/MYLANTA) 200-200-20 MG/5ML suspension, Take 15 mLs by mouth every 4 (four) hours as needed for indigestion or heartburn., Disp: 355 mL, Rfl: 0   blood glucose meter kit and supplies, Dispense based on patient and insurance preference. Use up to four times daily as directed. (FOR ICD-10 E10.9, E11.9)., Disp: 1 each, Rfl: 0   EPINEPHrine 0.3 mg/0.3 mL IJ SOAJ injection, Inject 0.3 mg into the muscle as needed for anaphylaxis., Disp: 1 each, Rfl: 1   famotidine (PEPCID) 20 MG tablet, Take 1 tablet (20 mg total) by mouth daily., Disp: 30 tablet, Rfl: 0   GABAPENTIN PO, Take by  mouth., Disp: , Rfl:    insulin glargine (LANTUS) 100 UNIT/ML Solostar Pen, INJECT 30 UNITS INTO THE SKIN DAILY WITH BREAKFAST., Disp: 15 mL, Rfl: 11   Insulin Pen Needle 32G X 4 MM MISC, 1 each by Does not apply route in the morning, at noon, in the evening, and at bedtime., Disp: 1000 each, Rfl: 0   pantoprazole (PROTONIX) 40 MG tablet, Take 1 tablet (40 mg total) by mouth daily., Disp: 30 tablet, Rfl: 0   predniSONE (DELTASONE) 50 MG tablet, Take 1 tablet daily until gone, Disp: 5 tablet, Rfl: 0   sucralfate (CARAFATE) 1 GM/10ML suspension, Take 10 mLs (1 g total) by mouth 4 (four) times daily -  with meals and at bedtime., Disp: 420 mL, Rfl: 0   Allergies  Allergen Reactions   Penicillins Swelling   Cephalexin Rash    Past Medical History:  Diagnosis Date   Diabetes mellitus without complication ([place])    Neuropathy      History reviewed. No pertinent surgical history.  Family History  Problem Relation Age of Onset   Healthy Mother    Diabetes Father    Heart failure Father     Social History   Tobacco Use   Smoking status: Former    Types: Cigarettes    Quit date: 05/11/2000    Years since quitting: 21.8   Smokeless tobacco: Never  Vaping Use   Vaping Use: Never used  Substance Use Topics   Alcohol use: Yes  Comment: socially   Drug use: No    ROS   Objective:   Vitals: BP (!) 164/105 (BP Location: Left Arm)   Pulse 95   Temp 98 F (36.7 C) (Oral)   Resp 16   SpO2 97%   BP Readings from Last 3 Encounters:  03/14/22 (!) 164/105  03/14/22 (!) 156/99  08/18/21 133/89   Physical Exam Constitutional:      General: He is not in acute distress.    Appearance: Normal appearance. He is well-developed and normal weight. He is not ill-appearing, toxic-appearing or diaphoretic.  HENT:     Head: Normocephalic and atraumatic.     Right Ear: External ear normal.     Left Ear: External ear normal.     Nose: Nose normal.     Mouth/Throat:     Pharynx:  Oropharynx is clear.  Eyes:     General: No scleral icterus.       Right eye: No discharge.        Left eye: No discharge.     Extraocular Movements: Extraocular movements intact.  Cardiovascular:     Rate and Rhythm: Normal rate.  Pulmonary:     Effort: Pulmonary effort is normal.  Genitourinary:    Rectum: Tenderness and internal hemorrhoid (bleeding) present. No anal fissure or external hemorrhoid.  Musculoskeletal:     Cervical back: Normal range of motion.  Neurological:     Mental Status: He is alert and oriented to person, place, and time.  Psychiatric:        Mood and Affect: Mood normal.        Behavior: Behavior normal.        Thought Content: Thought content normal.        Judgment: Judgment normal.     Results for orders placed or performed during the hospital encounter of 03/13/22 (from the past 24 hour(s))  Comprehensive metabolic panel     Status: Abnormal   Collection Time: 03/13/22  9:01 PM  Result Value Ref Range   Sodium 140 135 - 145 mmol/L   Potassium 4.3 3.5 - 5.1 mmol/L   Chloride 107 98 - 111 mmol/L   CO2 25 22 - 32 mmol/L   Glucose, Bld 153 (H) 70 - 99 mg/dL   BUN 15 6 - 20 mg/dL   Creatinine, Ser 0.98 0.61 - 1.24 mg/dL   Calcium 9.3 8.9 - 10.3 mg/dL   Total Protein 7.0 6.5 - 8.1 g/dL   Albumin 3.9 3.5 - 5.0 g/dL   AST 20 15 - 41 U/L   ALT 15 0 - 44 U/L   Alkaline Phosphatase 51 38 - 126 U/L   Total Bilirubin 0.4 0.3 - 1.2 mg/dL   GFR, Estimated >60 >60 mL/min   Anion gap 8 5 - 15  CBC     Status: None   Collection Time: 03/13/22  9:01 PM  Result Value Ref Range   WBC 7.9 4.0 - 10.5 K/uL   RBC 4.30 4.22 - 5.81 MIL/uL   Hemoglobin 13.8 13.0 - 17.0 g/dL   HCT 40.3 39.0 - 52.0 %   MCV 93.7 80.0 - 100.0 fL   MCH 32.1 26.0 - 34.0 pg   MCHC 34.2 30.0 - 36.0 g/dL   RDW 13.1 11.5 - 15.5 %   Platelets 252 150 - 400 K/uL   nRBC 0.0 0.0 - 0.2 %    Assessment and Plan :   PDMP not reviewed this encounter.  1. Internal hemorrhoid,  bleeding    2. Anal bleeding   3. Type 2 diabetes mellitus treated with insulin ([place])   4. Essential hypertension     Case discussed with both [facility] and [facility] practices directly.  Unfortunately we were not able to secure an appointment sooner than 03/24/2022.  The recommendation was to start a stool softener, use sitz bath's for local pain relief.  I am adding lidocaine gel to the regimen.  Present to the [facility] emergency room at the recommendation should he have worsening symptoms prior to his appointment. Counseled patient on potential for adverse effects with medications prescribed/recommended today, ER and return-to-clinic precautions discussed, patient verbalized understanding.      Jaynee Eagles, [place] 03/14/22 [phone]

## 2022-03-14 NOTE — Discharge Instructions (Addendum)
I have scheduled an [facility] on 03/24/2022 at 10:50am.  Please show up at 10:20 as you are new patient with our practice and need to have your intake forms filled.  Start a stool softener and eat a high-fiber diet including fiber supplement over-the-counter.  You can use the lidocaine gel for local pain relief, can also use sitz baths.  If between then and now you end up having worsening symptoms then please go to the [facility] emergency room where they have a GI doctor on call.

## 2022-03-14 NOTE — ED Triage Notes (Addendum)
Noticed blood in toilet yesterday, states it colored the water red. Mild nausea, reports some hard stools with some constipation.  Reports rectal pain, left sided. States he's having to use toilet paper to prevent it from getting on his underwear. Went to the ED and waited 12 hours without being seen. Denies fever, abdominal pain, V/D, dysuria, hematuria. Has not tried anything at home to help.

## 2024-04-04 ENCOUNTER — Emergency Department (HOSPITAL_COMMUNITY): Admission: EM | Admit: 2024-04-04 | Discharge: 2024-04-04 | Disposition: A | Discharge status: Home / Self Care

## 2024-04-04 ENCOUNTER — Other Ambulatory Visit: Payer: Self-pay

## 2024-04-04 ENCOUNTER — Encounter (HOSPITAL_COMMUNITY): Payer: Self-pay | Admitting: Emergency Medicine

## 2024-04-04 ENCOUNTER — Emergency Department (HOSPITAL_COMMUNITY)

## 2024-04-04 DIAGNOSIS — R42 Dizziness and giddiness: Secondary | ICD-10-CM | POA: Insufficient documentation

## 2024-04-04 DIAGNOSIS — Z794 Long term (current) use of insulin: Secondary | ICD-10-CM | POA: Insufficient documentation

## 2024-04-04 DIAGNOSIS — J45909 Unspecified asthma, uncomplicated: Secondary | ICD-10-CM | POA: Insufficient documentation

## 2024-04-04 DIAGNOSIS — R059 Cough, unspecified: Secondary | ICD-10-CM | POA: Insufficient documentation

## 2024-04-04 DIAGNOSIS — R058 Other specified cough: Secondary | ICD-10-CM | POA: Diagnosis present

## 2024-04-04 DIAGNOSIS — J168 Pneumonia due to other specified infectious organisms: Secondary | ICD-10-CM | POA: Insufficient documentation

## 2024-04-04 DIAGNOSIS — E1140 Type 2 diabetes mellitus with diabetic neuropathy, unspecified: Secondary | ICD-10-CM | POA: Diagnosis not present

## 2024-04-04 DIAGNOSIS — J189 Pneumonia, unspecified organism: Secondary | ICD-10-CM

## 2024-04-04 LAB — RESP PANEL BY RT-PCR (RSV, FLU A&B, COVID)  RVPGX2
Influenza A by PCR: NEGATIVE
Influenza B by PCR: NEGATIVE
Resp Syncytial Virus by PCR: NEGATIVE
SARS Coronavirus 2 by RT PCR: NEGATIVE

## 2024-04-04 MED ORDER — AZITHROMYCIN 250 MG PO TABS: 250.0000 mg | ORAL_TABLET | Freq: Every day | ORAL | 0 refills | Status: AC

## 2024-04-04 MED ORDER — PREDNISONE 50 MG PO TABS: 50.0000 mg | ORAL_TABLET | Freq: Every day | ORAL | 0 refills | Status: AC

## 2024-04-04 MED ORDER — IPRATROPIUM-ALBUTEROL 0.5-2.5 (3) MG/3ML IN SOLN
3.0000 mL | Freq: Once | RESPIRATORY_TRACT | Status: AC
  Administered 2024-04-04: 3 mL via RESPIRATORY_TRACT
  Filled 2024-04-04: qty 3

## 2024-04-04 NOTE — Discharge Instructions (Signed)
 Prescribing antibiotics.  Please take them as prescribed for the full course.  Continue to use your albuterol  inhaler.  We also prescribed you steroids.  Try to increase your fluid intake.  Return for fevers, chills, severe headache, passout, chest pain, palpitations or abnormal heartbeats or any new or worsening symptoms that are concerning to you.

## 2024-04-04 NOTE — ED Triage Notes (Signed)
 Patient presents due to chest congestion, productive coughing and dizziness. Patient has had symptoms for 2 days.

## 2024-04-04 NOTE — ED Provider Notes (Signed)
 [place] EMERGENCY DEPARTMENT AT [facility] Provider Note   CSN: [id number] Arrival date & time: 04/04/24  1629     Patient presents with: Cough and Dizziness   Guy Gonzalez is a 53 y.o. male.   53 year old male presenting emergency department with cough and chest congestion.  Having URI symptoms for few days productive cough.  Had some lightheadedness/woozy sensation earlier while he was driving and lasted a few seconds.  Resolved.  Not currently having.  No chest pain.  Decreased p.o. intake secondary to feeling generalized malaise.   Cough Dizziness      Prior to Admission medications   Medication Sig Start Date End Date Taking? Authorizing Provider  azithromycin  (ZITHROMAX ) 250 MG tablet Take 1 tablet (250 mg total) by mouth daily. Take first 2 tablets together, then 1 every day until finished. 04/04/24  Yes Neysa Caron PARAS, DO  predniSONE  (DELTASONE ) 50 MG tablet Take 1 tablet (50 mg total) by mouth daily. 04/04/24  Yes Neysa Caron PARAS, DO  acetaminophen  (TYLENOL ) 325 MG tablet Take 2 tablets (650 mg total) by mouth every 6 (six) hours as needed for moderate pain. 01/11/20   Malvina Ellen, MD  alum & mag hydroxide-simeth (MAALOX/MYLANTA) 200-200-20 MG/5ML suspension Take 15 mLs by mouth every 4 (four) hours as needed for indigestion or heartburn. 01/11/20   Malvina Ellen, MD  blood glucose meter kit and supplies Dispense based on patient and insurance preference. Use up to four times daily as directed. (FOR ICD-10 E10.9, E11.9). 01/11/20   Malvina Ellen, MD  docusate sodium  (COLACE) 100 MG capsule Take 1 capsule (100 mg total) by mouth every 12 (twelve) hours. 03/14/22   Christopher Savannah, PA-C  EPINEPHrine  0.3 mg/0.3 mL IJ SOAJ injection Inject 0.3 mg into the muscle as needed for anaphylaxis. 08/18/21   Claudene Leita BRAVO, NP  famotidine  (PEPCID ) 20 MG tablet Take 1 tablet (20 mg total) by mouth daily. 08/18/21 09/17/21  Smith, Laura E, NP  gabapentin (NEURONTIN) 300 MG  capsule Take 300 mg by mouth 3 (three) times daily.    [provider]  GABAPENTIN PO Take by mouth.    [provider]  insulin  glargine (LANTUS ) 100 UNIT/ML injection Inject 30 Units into the skin daily.    [provider]  insulin  glargine (LANTUS ) 100 UNIT/ML Solostar Pen INJECT 30 UNITS INTO THE SKIN DAILY WITH BREAKFAST. 01/11/20 01/10/21  Malvina Ellen, MD  Insulin  Pen Needle 32G X 4 MM MISC 1 each by Does not apply route in the morning, at noon, in the evening, and at bedtime. 01/11/20   Malvina Ellen, MD  Lidocaine , Anorectal, 5 % GEL Apply a pea-sized amount to the affected area 3 times daily. 03/14/22   Christopher Savannah, PA-C  losartan-hydrochlorothiazide  (HYZAAR) 50-12.5 MG tablet Take 1 tablet by mouth daily.    [provider]  metFORMIN  (GLUCOPHAGE ) 1000 MG tablet Take by mouth.    [provider]  pantoprazole  (PROTONIX ) 40 MG tablet Take 1 tablet (40 mg total) by mouth daily. 01/12/20   Malvina Ellen, MD  sucralfate  (CARAFATE ) 1 GM/10ML suspension Take 10 mLs (1 g total) by mouth 4 (four) times daily -  with meals and at bedtime. 01/11/20   Malvina Ellen, MD  hydrochlorothiazide  (HYDRODIURIL ) 12.5 MG tablet Take 1 tablet (12.5 mg total) by mouth daily. 10/22/17 08/14/19  Anitra Rocky KIDD, PA-C  ipratropium (ATROVENT ) 0.06 % nasal spray Place 2 sprays into both nostrils 4 (four) times daily. 05/09/18 08/14/19  Babara No  V, PA-C    Allergies: Penicillins and Cephalexin    Review of Systems  Respiratory:  Positive for cough.   Neurological:  Positive for dizziness.    Updated Vital Signs BP (!) 164/104   Pulse 79   Temp 98.2 F (36.8 C) (Oral)   Resp 17   SpO2 100%   Physical Exam Vitals and nursing note reviewed.  Constitutional:      General: He is not in acute distress.    Appearance: He is not toxic-appearing.  HENT:     Nose: Nose normal.     Mouth/Throat:     Mouth: Mucous membranes are moist.  Eyes:     Conjunctiva/sclera:  Conjunctivae normal.  Cardiovascular:     Rate and Rhythm: Normal rate.  Pulmonary:     Effort: Pulmonary effort is normal.     Breath sounds: Normal breath sounds.  Abdominal:     General: Abdomen is flat. There is no distension.     Palpations: Abdomen is soft.     Tenderness: There is no abdominal tenderness. There is no guarding or rebound.  Musculoskeletal:        General: Normal range of motion.  Skin:    General: Skin is warm and dry.     Capillary Refill: Capillary refill takes less than 2 seconds.  Neurological:     Mental Status: He is alert and oriented to person, place, and time.  Psychiatric:        Mood and Affect: Mood normal.        Behavior: Behavior normal.     (all labs ordered are listed, but only abnormal results are displayed) Labs Reviewed  RESP PANEL BY RT-PCR (RSV, FLU A&B, COVID)  [id number]    EKG: EKG Interpretation Date/Time:  Monday April 04 2024 16:52:39 EST Ventricular Rate:  89 PR Interval:  153 QRS Duration:  89 QT Interval:  337 QTC Calculation: 410 R Axis:   -34  Text Interpretation: Sinus rhythm Left axis deviation Low voltage, precordial leads Anteroseptal infarct, old No significant change since last tracing Confirmed by Randol Simmonds [phone]) on 04/04/2024 4:55:38 PM  Radiology: [facility] Chest 2 View Result Date: 04/04/2024 CLINICAL DATA:  Cough. EXAM: CHEST - 2 VIEW COMPARISON:  01/07/2020 FINDINGS: The heart size and mediastinal contours are within normal limits. Both lungs are clear. The visualized skeletal structures are unremarkable. IMPRESSION: No active cardiopulmonary disease. Electronically Signed   By: Juliene Balder M.D.   On: 04/04/2024 18:34     Procedures   Medications Ordered in the ED  ipratropium-albuterol  (DUONEB) 0.5-2.5 (3) MG/3ML nebulizer solution 3 mL (3 mLs Nebulization Given 04/04/24 1819)    Clinical Course as of 04/04/24 2244  Mon Apr 04, 2024  1833 Resp panel by RT-PCR (RSV, Flu A&B, Covid) Anterior Nasal  Swab Flu/COVID/RSV negative [TY]    Clinical Course User Index [TY] Neysa Caron PARAS, DO                                 Medical Decision Making 53 year old male presenting emergency department with URI symptoms/cough.  He is afebrile nontachycardic maintaining oxygen saturation on room air.  Does not appear to be in respiratory distress.  Does have a history of diabetes and neuropathy.  He reports ongoing lung issues from having COVID.  Uses an albuterol  inhaler regularly.  Symptoms consistent with viral URI, however flu/COVID/RSV negative.  No indication for antiviral therapy.  Chest x-ray my independent review does appear to have infiltrate/positive spine sign.  However radiology reading is negative.  Given his symptoms we will treat with a short course of antibiotics for pneumonia. Short course of steriods for RAD. I suspect his wooziness secondary to decreased p.o. intake.  Not currently having symptoms.  EKG appears normal sinus rhythm.  Also normal sinus rhythm on the monitor in the room.  Discussed increasing p.o. intake.  Was given DuoNeb here.    Amount and/or Complexity of Data Reviewed Labs:  Decision-making details documented in ED Course. Radiology: ordered and independent interpretation performed. ECG/medicine tests: ordered.  Risk Prescription drug management.       Final diagnoses:  Pneumonia of left lower lobe due to infectious organism    ED Discharge Orders          Ordered    azithromycin  (ZITHROMAX ) 250 MG tablet  Daily        04/04/24 1854    predniSONE  (DELTASONE ) 50 MG tablet  Daily        04/04/24 1854               Neysa Caron PARAS, DO 04/04/24 2244
